# Patient Record
Sex: Female | Born: 1945 | Race: White | Hispanic: No | Marital: Married | Smoking: Former smoker
Health system: Southern US, Community
[De-identification: ages and names within clinical notes are randomized; demographics above are authoritative.]

## PROBLEM LIST (undated history)

## (undated) DIAGNOSIS — E669 Obesity, unspecified: Secondary | ICD-10-CM

## (undated) DIAGNOSIS — K219 Gastro-esophageal reflux disease without esophagitis: Secondary | ICD-10-CM

## (undated) DIAGNOSIS — G4730 Sleep apnea, unspecified: Secondary | ICD-10-CM

## (undated) DIAGNOSIS — E876 Hypokalemia: Secondary | ICD-10-CM

## (undated) DIAGNOSIS — D649 Anemia, unspecified: Secondary | ICD-10-CM

## (undated) DIAGNOSIS — E079 Disorder of thyroid, unspecified: Secondary | ICD-10-CM

## (undated) DIAGNOSIS — C801 Malignant (primary) neoplasm, unspecified: Secondary | ICD-10-CM

## (undated) DIAGNOSIS — K222 Esophageal obstruction: Secondary | ICD-10-CM

## (undated) DIAGNOSIS — E785 Hyperlipidemia, unspecified: Secondary | ICD-10-CM

## (undated) DIAGNOSIS — K5792 Diverticulitis of intestine, part unspecified, without perforation or abscess without bleeding: Secondary | ICD-10-CM

## (undated) DIAGNOSIS — I10 Essential (primary) hypertension: Secondary | ICD-10-CM

## (undated) HISTORY — PX: GASTRIC BYPASS: SHX52

## (undated) HISTORY — DX: Hyperlipidemia, unspecified: E78.5

## (undated) HISTORY — DX: Sleep apnea, unspecified: G47.30

## (undated) HISTORY — DX: Anemia, unspecified: D64.9

## (undated) HISTORY — DX: Malignant (primary) neoplasm, unspecified: C80.1

## (undated) HISTORY — DX: Obesity, unspecified: E66.9

## (undated) HISTORY — DX: Hypokalemia: E87.6

## (undated) HISTORY — DX: Esophageal obstruction: K22.2

## (undated) HISTORY — DX: Gastro-esophageal reflux disease without esophagitis: K21.9

---

## 1999-09-14 ENCOUNTER — Ambulatory Visit (HOSPITAL_BASED_OUTPATIENT_CLINIC_OR_DEPARTMENT_OTHER): Admission: RE | Admit: 1999-09-14 | Discharge: 1999-09-14 | Payer: Self-pay | Admitting: Plastic Surgery

## 1999-09-25 ENCOUNTER — Other Ambulatory Visit: Admission: RE | Admit: 1999-09-25 | Discharge: 1999-09-25 | Payer: Self-pay | Admitting: Family Medicine

## 2000-06-04 ENCOUNTER — Other Ambulatory Visit: Admission: RE | Admit: 2000-06-04 | Discharge: 2000-06-04 | Payer: Self-pay | Admitting: *Deleted

## 2000-06-04 ENCOUNTER — Encounter (INDEPENDENT_AMBULATORY_CARE_PROVIDER_SITE_OTHER): Payer: Self-pay | Admitting: Specialist

## 2000-09-26 ENCOUNTER — Other Ambulatory Visit: Admission: RE | Admit: 2000-09-26 | Discharge: 2000-09-26 | Payer: Self-pay | Admitting: *Deleted

## 2002-05-01 ENCOUNTER — Ambulatory Visit (HOSPITAL_COMMUNITY): Admission: RE | Admit: 2002-05-01 | Discharge: 2002-05-01 | Payer: Self-pay | Admitting: Gastroenterology

## 2002-10-26 ENCOUNTER — Other Ambulatory Visit: Admission: RE | Admit: 2002-10-26 | Discharge: 2002-10-26 | Payer: Self-pay | Admitting: Family Medicine

## 2003-10-05 ENCOUNTER — Emergency Department (HOSPITAL_COMMUNITY): Admission: EM | Admit: 2003-10-05 | Discharge: 2003-10-05 | Payer: Self-pay | Admitting: Emergency Medicine

## 2003-10-28 ENCOUNTER — Encounter: Admission: RE | Admit: 2003-10-28 | Discharge: 2003-10-28 | Payer: Self-pay | Admitting: Family Medicine

## 2003-11-03 ENCOUNTER — Encounter: Admission: RE | Admit: 2003-11-03 | Discharge: 2003-11-03 | Payer: Self-pay | Admitting: Family Medicine

## 2004-12-26 ENCOUNTER — Encounter: Admission: RE | Admit: 2004-12-26 | Discharge: 2005-03-26 | Payer: Self-pay | Admitting: Family Medicine

## 2005-01-24 ENCOUNTER — Other Ambulatory Visit: Admission: RE | Admit: 2005-01-24 | Discharge: 2005-01-24 | Payer: Self-pay | Admitting: Family Medicine

## 2005-02-05 ENCOUNTER — Encounter: Admission: RE | Admit: 2005-02-05 | Discharge: 2005-02-05 | Payer: Self-pay | Admitting: Family Medicine

## 2006-01-28 ENCOUNTER — Encounter: Admission: RE | Admit: 2006-01-28 | Discharge: 2006-01-28 | Payer: Self-pay | Admitting: Family Medicine

## 2006-01-31 ENCOUNTER — Encounter: Admission: RE | Admit: 2006-01-31 | Discharge: 2006-01-31 | Payer: Self-pay | Admitting: Family Medicine

## 2006-02-08 ENCOUNTER — Ambulatory Visit: Payer: Self-pay | Admitting: Internal Medicine

## 2006-08-07 ENCOUNTER — Encounter: Admission: RE | Admit: 2006-08-07 | Discharge: 2006-08-07 | Payer: Self-pay | Admitting: Family Medicine

## 2006-12-18 ENCOUNTER — Ambulatory Visit: Payer: Self-pay | Admitting: *Deleted

## 2006-12-19 ENCOUNTER — Inpatient Hospital Stay (HOSPITAL_COMMUNITY): Admission: EM | Admit: 2006-12-19 | Discharge: 2006-12-20 | Payer: Self-pay | Admitting: Emergency Medicine

## 2006-12-19 ENCOUNTER — Ambulatory Visit: Payer: Self-pay | Admitting: Cardiology

## 2006-12-19 ENCOUNTER — Encounter: Payer: Self-pay | Admitting: Cardiology

## 2006-12-20 ENCOUNTER — Emergency Department (HOSPITAL_COMMUNITY): Admission: EM | Admit: 2006-12-20 | Discharge: 2006-12-21 | Payer: Self-pay | Admitting: Emergency Medicine

## 2007-02-19 ENCOUNTER — Ambulatory Visit: Payer: Self-pay | Admitting: *Deleted

## 2007-02-24 ENCOUNTER — Ambulatory Visit: Payer: Self-pay | Admitting: Internal Medicine

## 2007-02-24 ENCOUNTER — Ambulatory Visit: Payer: Self-pay

## 2007-02-24 LAB — CONVERTED CEMR LAB
CO2: 29 meq/L (ref 19–32)
Creatinine, Ser: 1.1 mg/dL (ref 0.4–1.2)
Glucose, Bld: 88 mg/dL (ref 70–99)
Potassium: 3.7 meq/L (ref 3.5–5.1)
Sodium: 144 meq/L (ref 135–145)

## 2007-03-05 ENCOUNTER — Ambulatory Visit: Payer: Self-pay

## 2007-03-05 ENCOUNTER — Ambulatory Visit: Payer: Self-pay | Admitting: Cardiology

## 2007-04-03 ENCOUNTER — Ambulatory Visit: Payer: Self-pay | Admitting: Internal Medicine

## 2007-04-03 LAB — CONVERTED CEMR LAB
CO2: 30 meq/L (ref 19–32)
GFR calc Af Amer: 59 mL/min
Glucose, Bld: 127 mg/dL — ABNORMAL HIGH (ref 70–99)
Potassium: 3.2 meq/L — ABNORMAL LOW (ref 3.5–5.1)

## 2007-06-06 ENCOUNTER — Ambulatory Visit: Payer: Self-pay | Admitting: Internal Medicine

## 2007-06-06 LAB — CONVERTED CEMR LAB
Calcium: 9 mg/dL (ref 8.4–10.5)
Chloride: 106 meq/L (ref 96–112)
Creatinine, Ser: 1.1 mg/dL (ref 0.4–1.2)
Glucose, Bld: 132 mg/dL — ABNORMAL HIGH (ref 70–99)
Sodium: 142 meq/L (ref 135–145)

## 2007-07-21 ENCOUNTER — Encounter: Admission: RE | Admit: 2007-07-21 | Discharge: 2007-07-21 | Payer: Self-pay | Admitting: Internal Medicine

## 2007-09-08 ENCOUNTER — Ambulatory Visit: Payer: Self-pay | Admitting: Gastroenterology

## 2007-09-22 ENCOUNTER — Ambulatory Visit: Payer: Self-pay | Admitting: Gastroenterology

## 2007-09-22 DIAGNOSIS — K222 Esophageal obstruction: Secondary | ICD-10-CM

## 2007-11-06 DIAGNOSIS — Z862 Personal history of diseases of the blood and blood-forming organs and certain disorders involving the immune mechanism: Secondary | ICD-10-CM

## 2007-11-06 DIAGNOSIS — G4733 Obstructive sleep apnea (adult) (pediatric): Secondary | ICD-10-CM

## 2007-11-06 DIAGNOSIS — F411 Generalized anxiety disorder: Secondary | ICD-10-CM | POA: Insufficient documentation

## 2007-11-06 DIAGNOSIS — Z8679 Personal history of other diseases of the circulatory system: Secondary | ICD-10-CM | POA: Insufficient documentation

## 2007-11-06 DIAGNOSIS — E669 Obesity, unspecified: Secondary | ICD-10-CM

## 2007-11-06 DIAGNOSIS — E119 Type 2 diabetes mellitus without complications: Secondary | ICD-10-CM

## 2007-11-06 DIAGNOSIS — E785 Hyperlipidemia, unspecified: Secondary | ICD-10-CM | POA: Insufficient documentation

## 2007-11-06 DIAGNOSIS — K219 Gastro-esophageal reflux disease without esophagitis: Secondary | ICD-10-CM | POA: Insufficient documentation

## 2007-11-06 DIAGNOSIS — M129 Arthropathy, unspecified: Secondary | ICD-10-CM | POA: Insufficient documentation

## 2007-11-06 DIAGNOSIS — I10 Essential (primary) hypertension: Secondary | ICD-10-CM | POA: Insufficient documentation

## 2007-11-06 DIAGNOSIS — R1310 Dysphagia, unspecified: Secondary | ICD-10-CM | POA: Insufficient documentation

## 2008-01-21 IMAGING — CT CT ANGIO CHEST
2 of 11 series · 13 of 36 positions shown · non-contrast
Comparison: none

CLINICAL DATA: Chest pain, shortness of breath

[Series 5: aortic dissection · axial · 0.80mm/px · z∈[-404,-44]mm · 10 of 177 slices shown]
[im 17/177  lung]
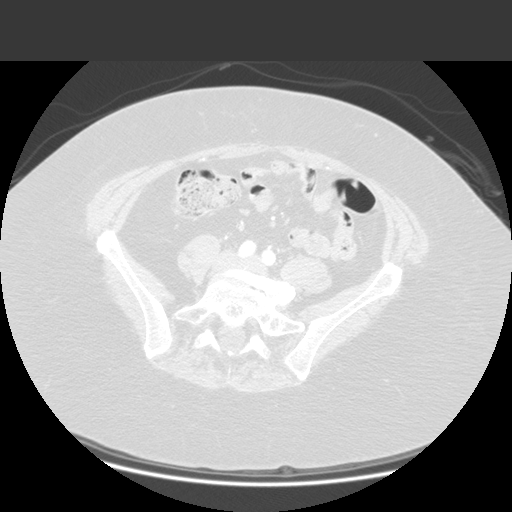
[im 33/177  mediastinal]
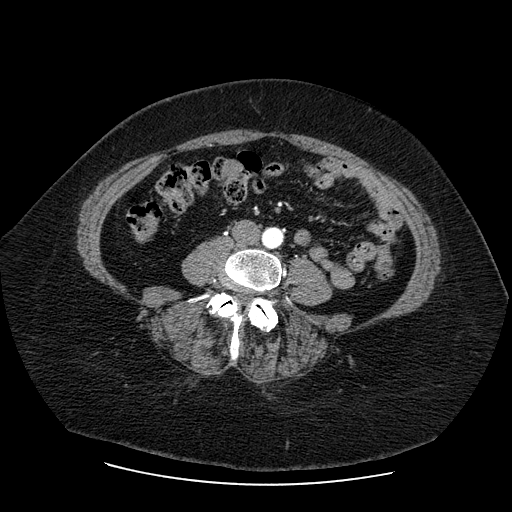
[im 49/177  lung]
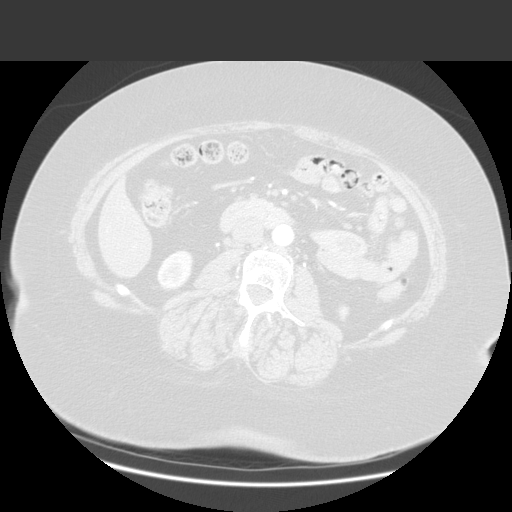
[im 65/177  mediastinal]
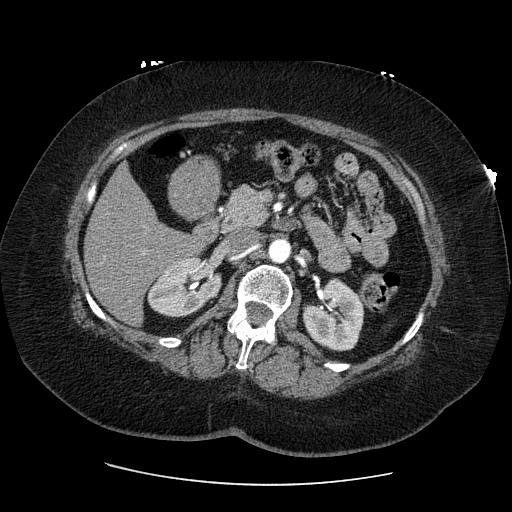
[im 81/177  lung]
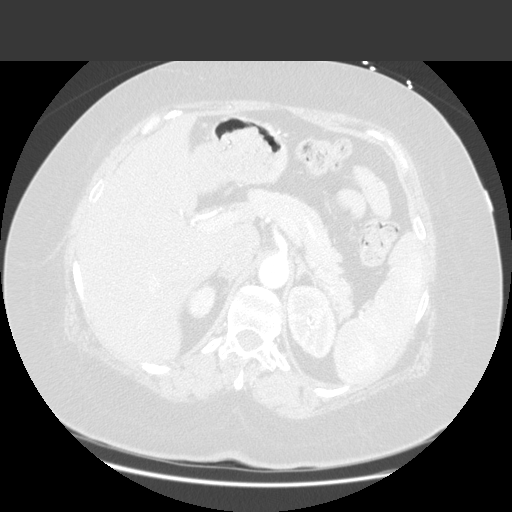
[im 97/177  mediastinal]
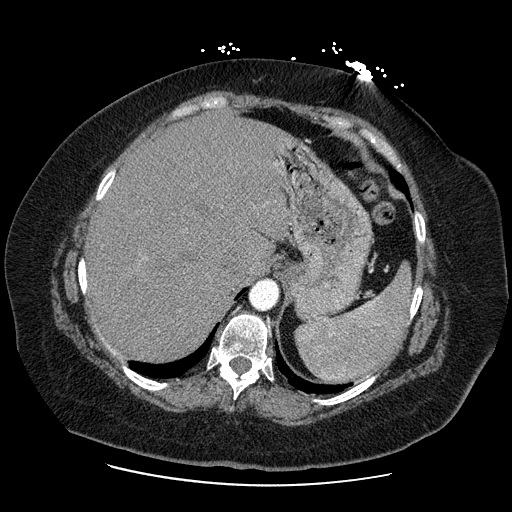
[im 113/177  lung]
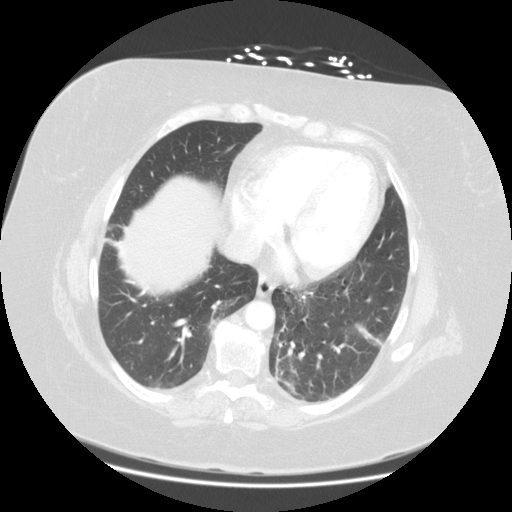
[im 129/177  mediastinal]
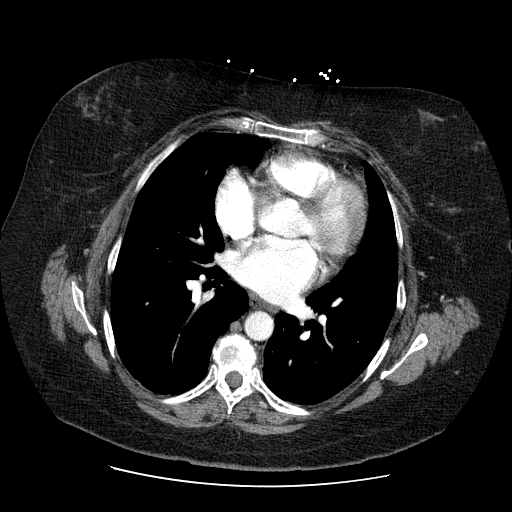
[im 145/177  lung]
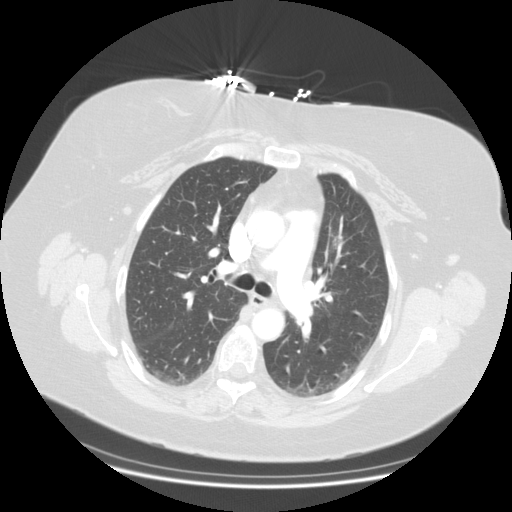
[im 161/177  mediastinal]
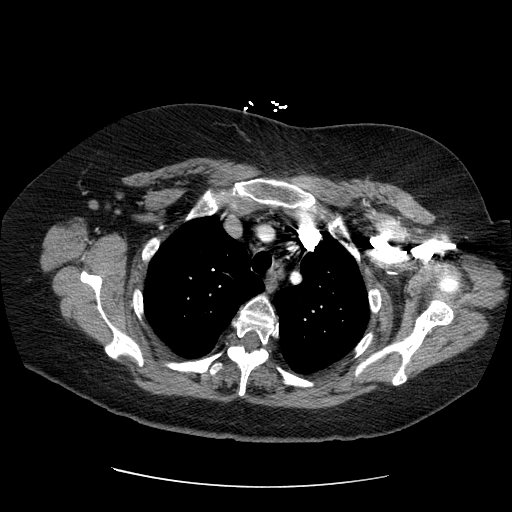

[Series 701: reformatted · coronal · 0.88mm/px · 3 of 173 slices shown]
[im 35/173  mediastinal]
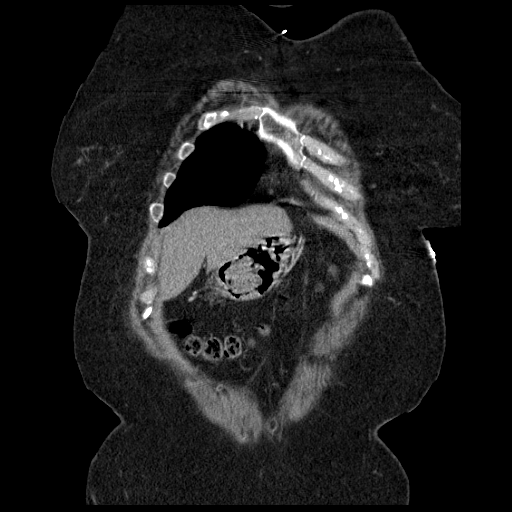
[im 69/173  mediastinal]
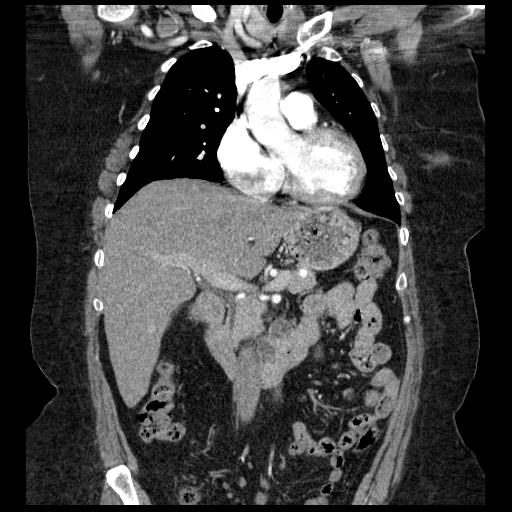
[im 104/173  mediastinal]
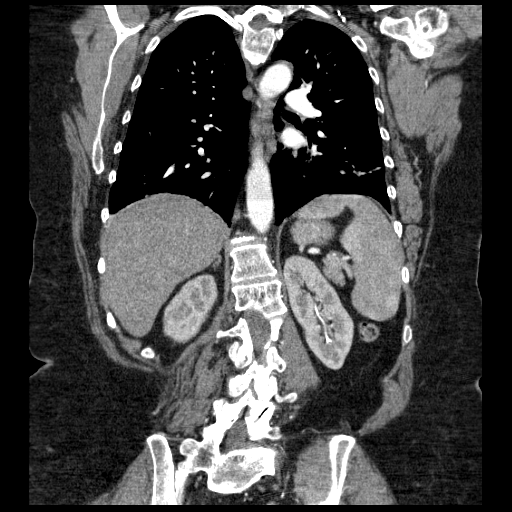

[13 of 36 positions shown; findings below may reference images not displayed]

CT angiogram chest with contrast:

Multidetector helical CT of the chest and abdomen was obtained after 120 ml
Omnipaque 350 IV. CT multiplanar reconstructions were rendered to evaluate the
vascular anatomy.
Comparison 01/31/2006.
The noncontrast scan shows no hyperdense aortic hematoma.
CTA shows good contrast enhancement of the thoracic aorta. No evidence of
dissection, stenosis, or other focal lesion. Classic anatomy   of the
brachiocephalic vessels without proximal stenosis. There is good contrast
opacification of pulmonary artery branches with no filling defects to suggest
acute PE. No pleural or pericardial effusion. Subcentimeter prevascular and
bilateral axillary lymph nodes incidentally noted. No hilar adenopathy.
Bilateral low attenuation thyroid lesions are incidentally noted. Lung windows
show dependent atelectasis posteriorly in both lower lobes. Linear scarring or
atelectasis in the lung bases. No confluent airspace infiltrate or nodule.
Coronal and sagittal reconstructions confirm the above findings. Mild
spondylitic changes in the thoracic spine.
IMPRESSION: 1. Negative for thoracic artery dissection or pulmonary embolus.
2. Nonspecific bilateral thyroid lesions. Consider ultrasound for further
evaluation.

CT angiogram abdomen with contrast:

Abdominal aorta normal in caliber and contour without dissection or stenosis.
Celiac axis and SMA widely patent. Replaced right hepatic artery from the SMA,
an anatomic variant. Single left and single right renal arteries without
stenosis or other focal lesion. Inferior mesenteric artery remains patent.
Common iliac arteries and   visualized portions of proximal external iliac
arteries widely patent. Circumaortic left renal vein is incidentally noted.

Unremarkable arterial phase evaluation of liver, gallbladder, spleen, adrenal
glands, kidneys, pancreas. Visualized portions of small bowel and colon are
decompressed, unremarkable. No free air. No ascites. Subcentimeter left
paraaortic and aortocaval lymph nodes incidentally noted. Degenerative changes
in the lower lumbar spine.
IMPRESSION: 1. Negative for aortic dissection or other acute abdominal process.

## 2008-04-01 ENCOUNTER — Encounter: Payer: Self-pay | Admitting: Gastroenterology

## 2008-04-05 ENCOUNTER — Ambulatory Visit: Payer: Self-pay | Admitting: Gastroenterology

## 2008-04-05 ENCOUNTER — Encounter: Admission: RE | Admit: 2008-04-05 | Discharge: 2008-04-05 | Payer: Self-pay | Admitting: Family Medicine

## 2008-04-05 DIAGNOSIS — R198 Other specified symptoms and signs involving the digestive system and abdomen: Secondary | ICD-10-CM

## 2008-04-09 ENCOUNTER — Ambulatory Visit: Payer: Self-pay | Admitting: Gastroenterology

## 2008-04-09 ENCOUNTER — Encounter: Payer: Self-pay | Admitting: Gastroenterology

## 2008-04-13 ENCOUNTER — Encounter: Payer: Self-pay | Admitting: Gastroenterology

## 2008-04-14 ENCOUNTER — Telehealth: Payer: Self-pay | Admitting: Gastroenterology

## 2008-04-20 ENCOUNTER — Telehealth: Payer: Self-pay | Admitting: Gastroenterology

## 2008-05-04 ENCOUNTER — Ambulatory Visit: Payer: Self-pay | Admitting: Gastroenterology

## 2008-12-10 DIAGNOSIS — I4949 Other premature depolarization: Secondary | ICD-10-CM | POA: Insufficient documentation

## 2008-12-10 DIAGNOSIS — I10 Essential (primary) hypertension: Secondary | ICD-10-CM | POA: Insufficient documentation

## 2008-12-14 ENCOUNTER — Encounter: Payer: Self-pay | Admitting: Internal Medicine

## 2008-12-14 ENCOUNTER — Ambulatory Visit: Payer: Self-pay | Admitting: Internal Medicine

## 2008-12-14 DIAGNOSIS — R072 Precordial pain: Secondary | ICD-10-CM | POA: Insufficient documentation

## 2009-02-18 ENCOUNTER — Encounter: Admission: RE | Admit: 2009-02-18 | Discharge: 2009-02-18 | Payer: Self-pay | Admitting: Family Medicine

## 2009-03-04 ENCOUNTER — Ambulatory Visit (HOSPITAL_COMMUNITY): Admission: RE | Admit: 2009-03-04 | Discharge: 2009-03-04 | Payer: Self-pay | Admitting: General Surgery

## 2009-03-10 ENCOUNTER — Ambulatory Visit (HOSPITAL_COMMUNITY): Admission: RE | Admit: 2009-03-10 | Discharge: 2009-03-10 | Payer: Self-pay | Admitting: General Surgery

## 2009-03-16 ENCOUNTER — Encounter: Admission: RE | Admit: 2009-03-16 | Discharge: 2009-03-16 | Payer: Self-pay | Admitting: General Surgery

## 2009-04-12 ENCOUNTER — Encounter (HOSPITAL_COMMUNITY): Admission: RE | Admit: 2009-04-12 | Discharge: 2009-07-11 | Payer: Self-pay | Admitting: Endocrinology

## 2009-06-30 ENCOUNTER — Encounter: Admission: RE | Admit: 2009-06-30 | Discharge: 2009-09-28 | Payer: Self-pay | Admitting: General Surgery

## 2009-07-25 ENCOUNTER — Inpatient Hospital Stay (HOSPITAL_COMMUNITY): Admission: RE | Admit: 2009-07-25 | Discharge: 2009-07-27 | Payer: Self-pay | Admitting: General Surgery

## 2009-07-26 ENCOUNTER — Ambulatory Visit: Payer: Self-pay | Admitting: Surgery

## 2009-07-26 ENCOUNTER — Encounter (INDEPENDENT_AMBULATORY_CARE_PROVIDER_SITE_OTHER): Payer: Self-pay | Admitting: General Surgery

## 2009-08-17 ENCOUNTER — Encounter: Admission: RE | Admit: 2009-08-17 | Discharge: 2009-08-17 | Payer: Self-pay | Admitting: Endocrinology

## 2009-10-27 ENCOUNTER — Encounter: Admission: RE | Admit: 2009-10-27 | Discharge: 2010-01-25 | Payer: Self-pay | Admitting: General Surgery

## 2009-11-04 ENCOUNTER — Ambulatory Visit (HOSPITAL_COMMUNITY): Admission: RE | Admit: 2009-11-04 | Discharge: 2009-11-04 | Payer: Self-pay | Admitting: Endocrinology

## 2010-01-26 ENCOUNTER — Encounter: Admission: RE | Admit: 2010-01-26 | Discharge: 2010-01-26 | Payer: Self-pay | Admitting: General Surgery

## 2010-03-02 ENCOUNTER — Encounter: Admission: RE | Admit: 2010-03-02 | Discharge: 2010-03-02 | Payer: Self-pay | Admitting: Family Medicine

## 2010-03-15 ENCOUNTER — Ambulatory Visit (HOSPITAL_COMMUNITY): Admission: RE | Admit: 2010-03-15 | Discharge: 2010-03-15 | Payer: Self-pay | Admitting: Family Medicine

## 2010-04-12 IMAGING — US US ABDOMEN COMPLETE
1 series · 13 of 25 positions shown · non-contrast
Comparison: None

CLINICAL DATA: Morbid obesity

COMPLETE ABDOMINAL ULTRASOUND

[Series 1: us abdomen complete · 13 of 74 slices shown]
[im 1/74]
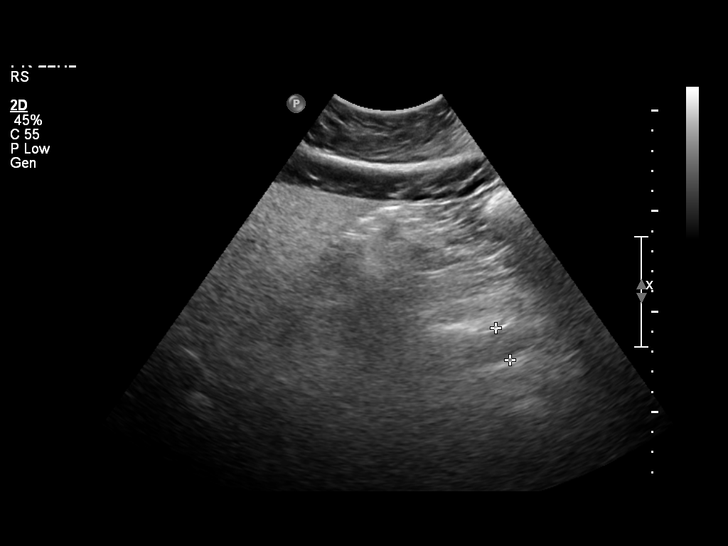
[im 7/74]
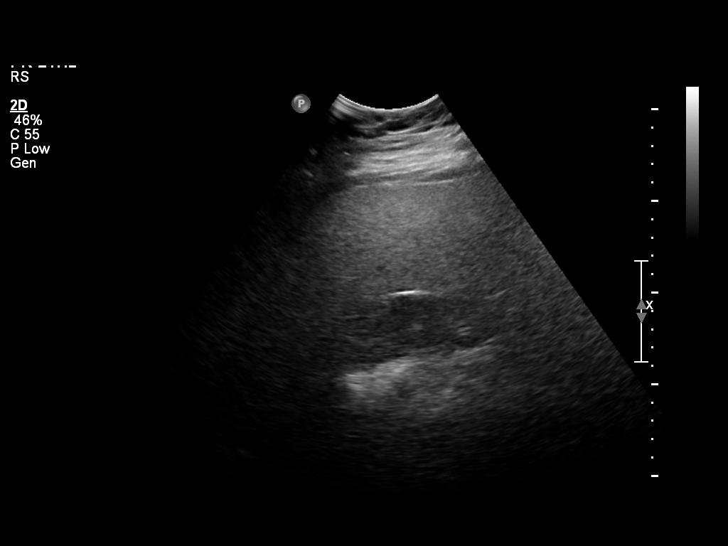
[im 13/74]
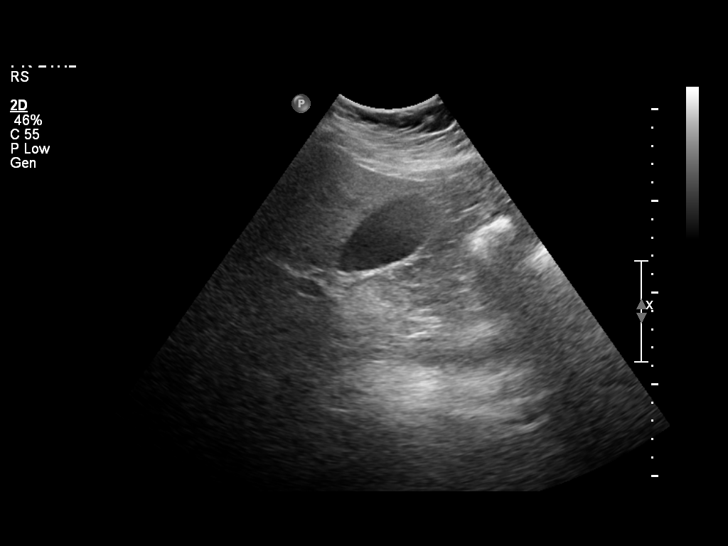
[im 19/74]
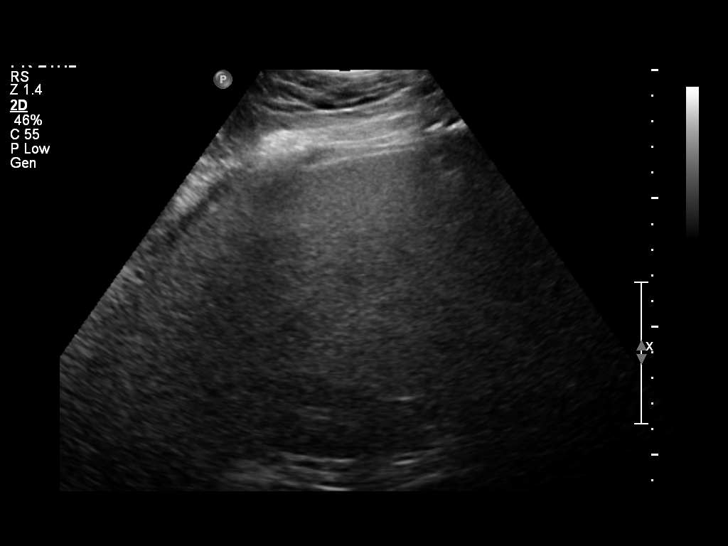
[im 25/74]
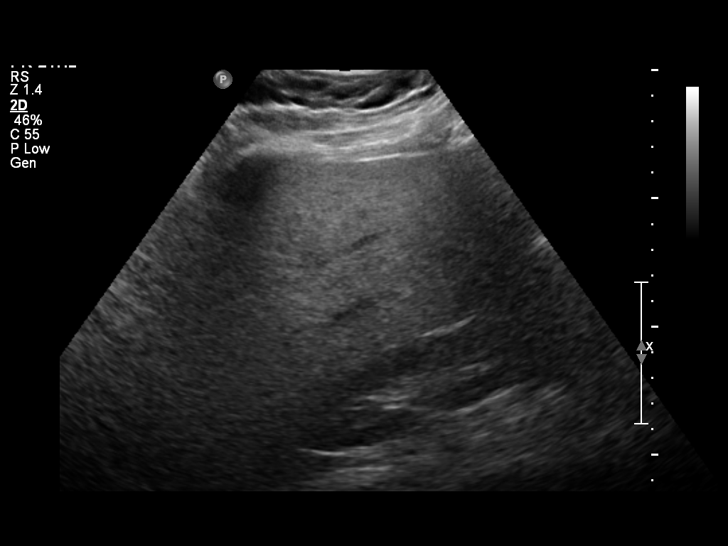
[im 31/74]
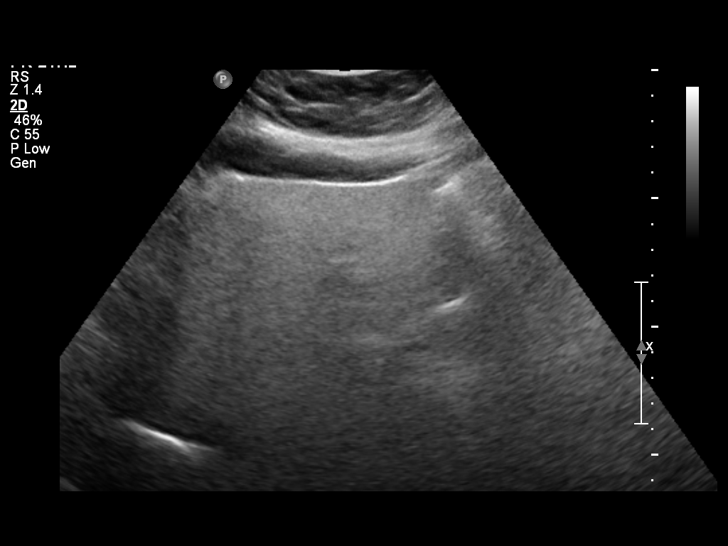
[im 37/74]
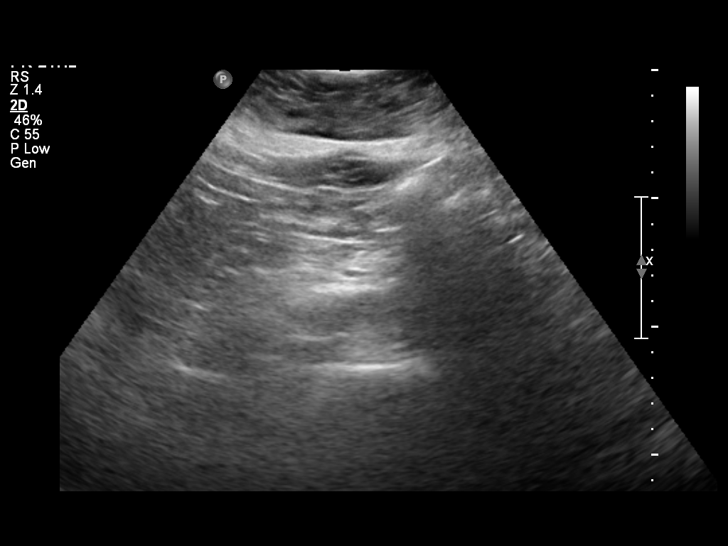
[im 43/74]
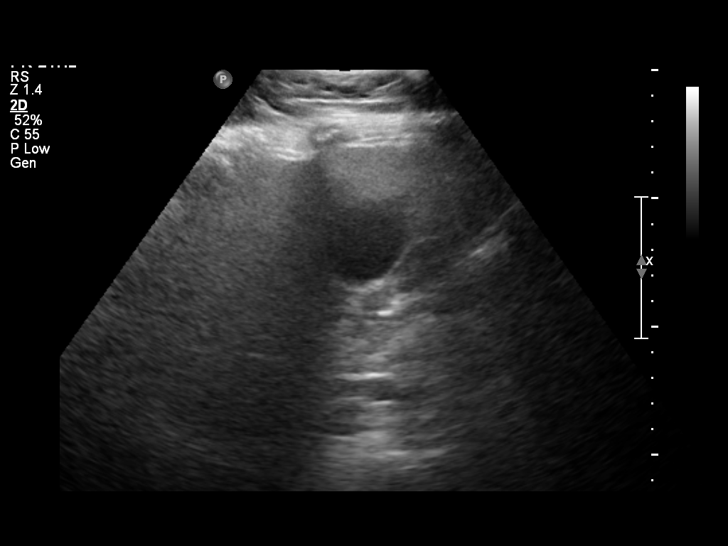
[im 49/74]
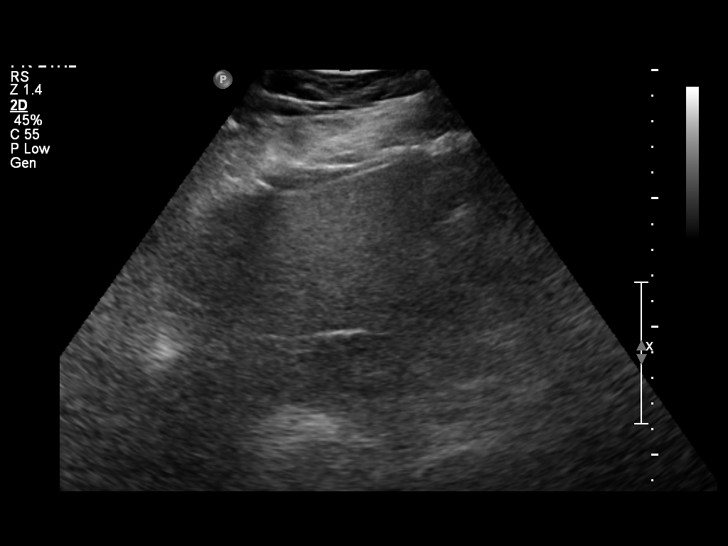
[im 55/74]
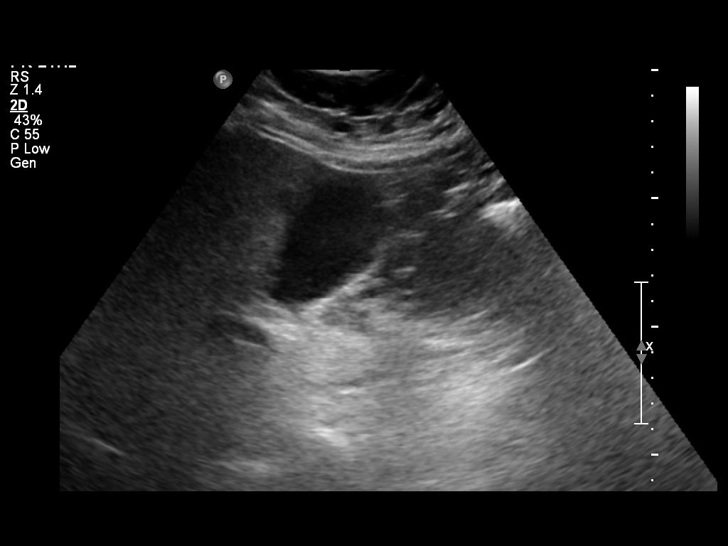
[im 61/74]
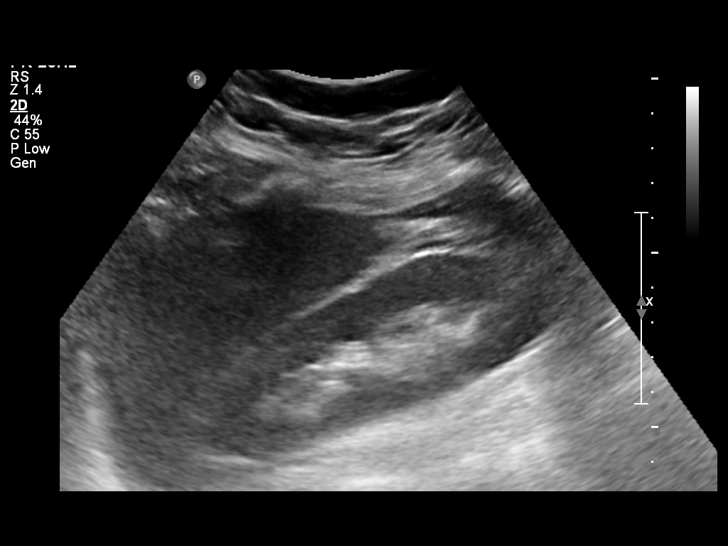
[im 67/74]
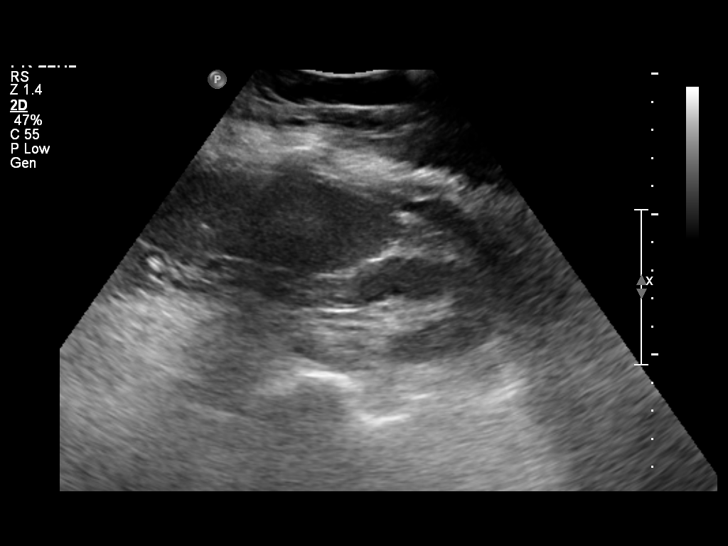
[im 74/74]
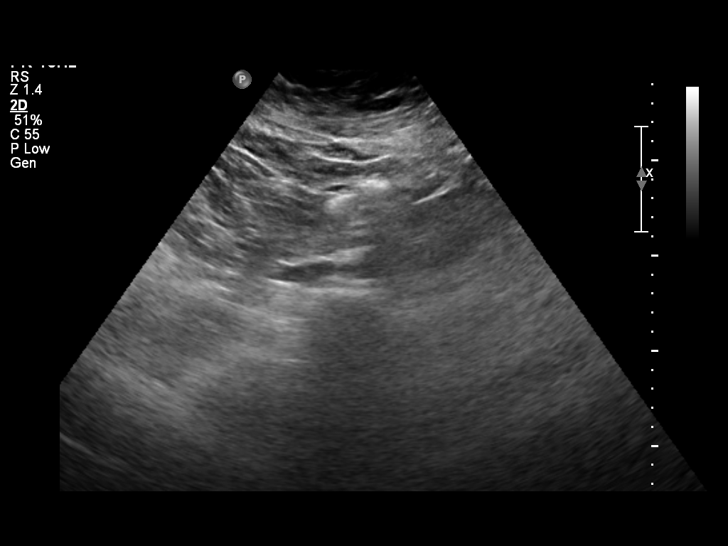

[13 of 25 positions shown; findings below may reference images not displayed]

FINDINGS: Gallbladder:  Distended.  Overall scan resolution is hampered by
patient body habitus but no evidence for intraluminal stones or
sludge is suggested.  No gallbladder wall thickening is suggested.

Common bile duct:  Normal AP width of 4.2 mm

Liver:  Diffusely echogenic with obscuration of the ductal and
venous radicals compatible with diffuse fatty infiltration.  No
definite focal parenchymal abnormalities are seen but evaluation is
hampered by the presence of the diffuse fatty infiltration.

IVC:  Shadowed by the fatty replaced liver.  Not assessed

Pancreas:  Shadowed by overlying bowel gas.  Not assessed

Spleen:  Normal in size and echotexture

Right Kidney:  Sagittal length of 9.2 cm.  Scanning resolution of
the right kidney is diminished by patient habitus but no definite
parenchymal abnormalities or hydronephrosis is evident

Left Kidney:  Normal in appearance with a sagittal length of
cm.  No focal parenchymal abnormalities or hydronephrosis evident

Abdominal aorta:  Poorly evaluated due to patient body habitus and
poor imaging characteristics on today's exam.
IMPRESSION: Overall scan resolution is diminished by the presence of diffuse
fatty infiltration combined with patient body habitus.  Incomplete
assessment of the pancreas, inferior vena cava, aorta and
intrahepatic architecture was possible as a result.  No definite
focal abnormality was noted in the evaluated areas.

## 2010-04-12 IMAGING — CR DG CHEST 2V
2 series · 2 of 2 positions shown · non-contrast
Comparison: 12/19/2006

CLINICAL DATA: Bariatric screening evaluation. History of
hypertension treated with medication.  No other current chest
complaints

CHEST - 2 VIEW

[view not recorded (1 of 2)]
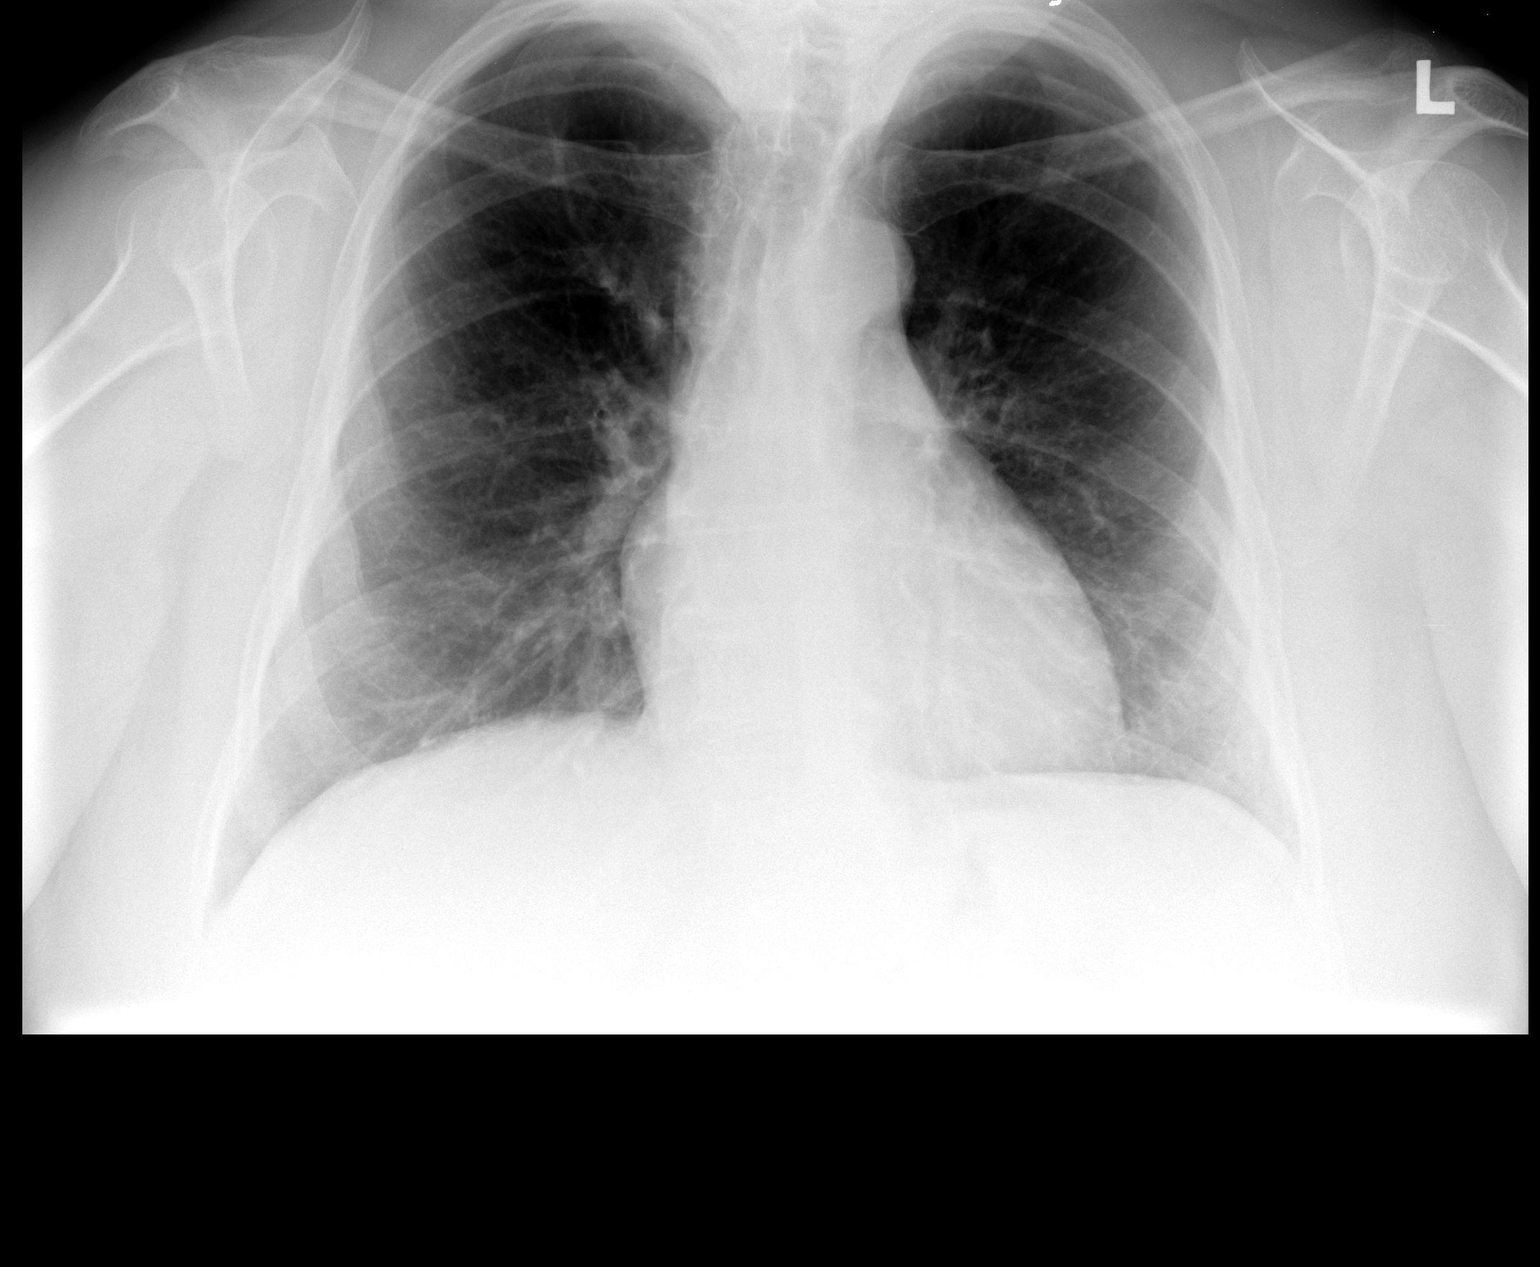

[view not recorded (2 of 2)]
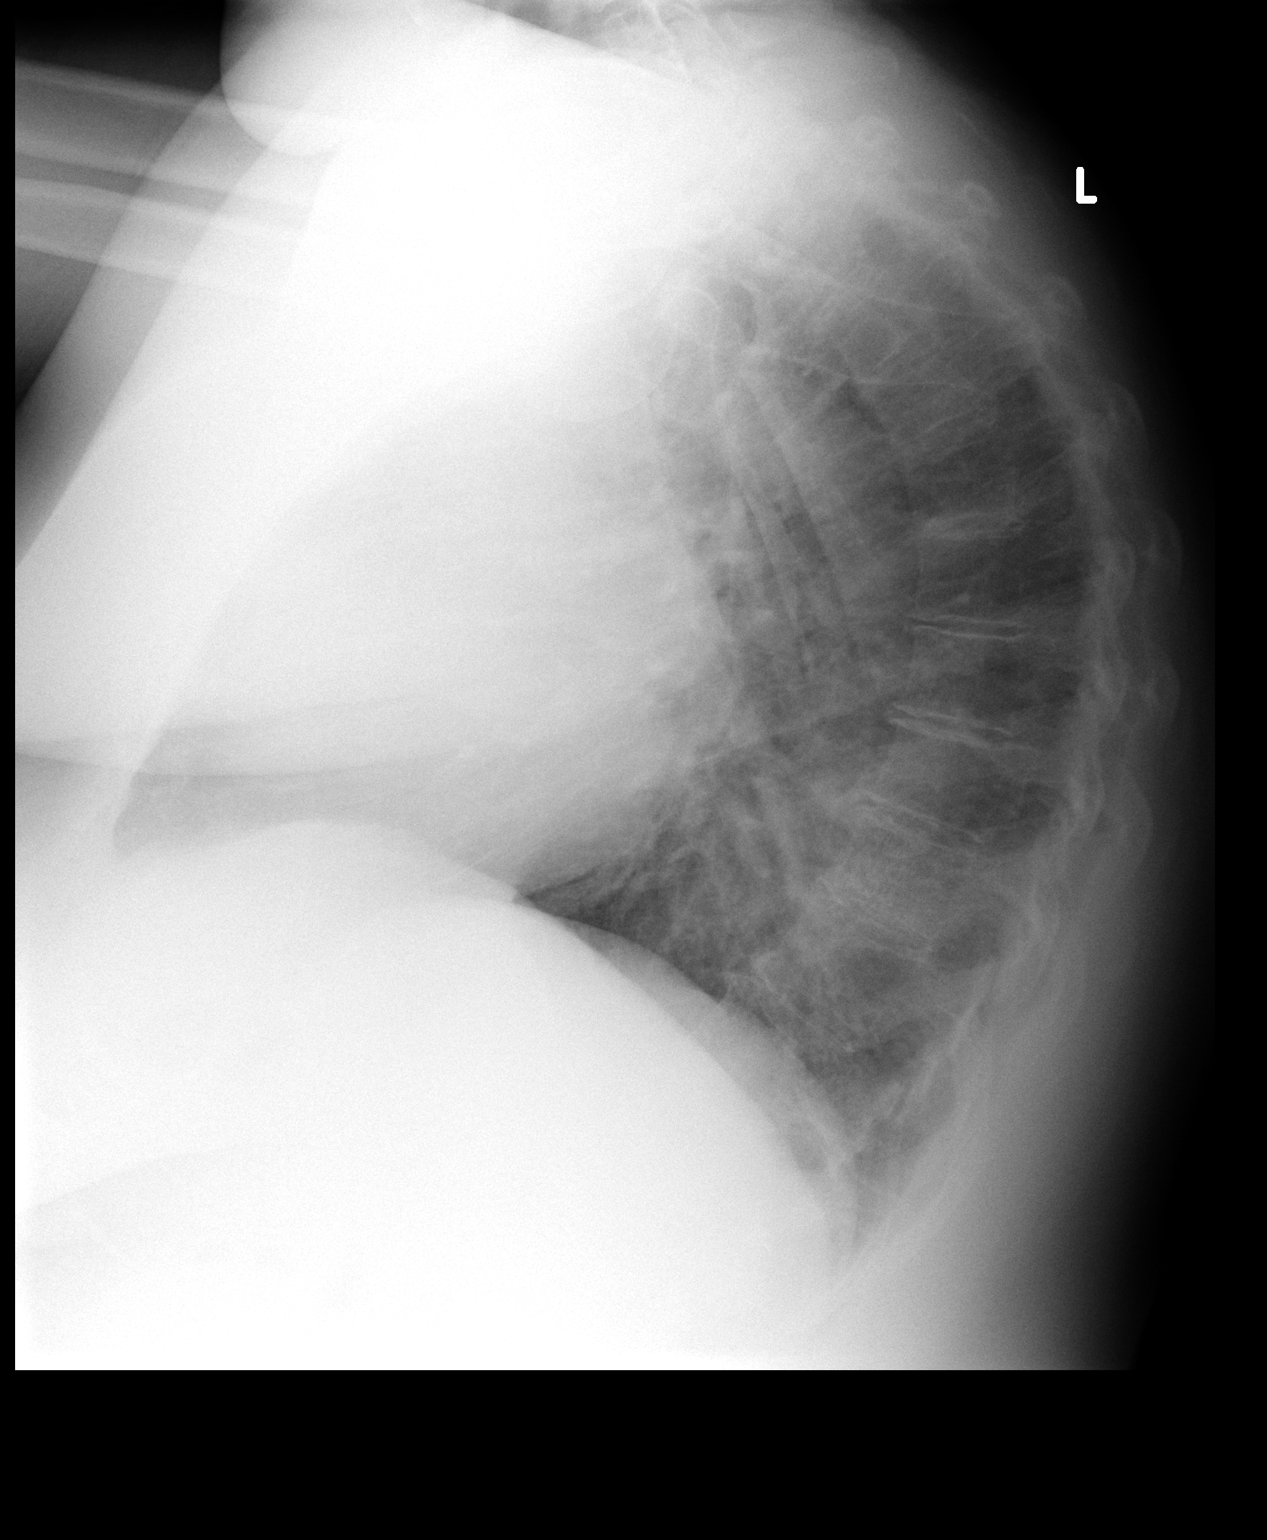

[2 of 2 positions shown; findings below may reference images not displayed]

FINDINGS: Heart and mediastinal contours are within normal limits.
The lung fields appear clear with no evidence for focal infiltrate
or congestive failure.  Bony structures demonstrate some mild
degenerative changes of the lower thoracic spine and otherwise
appear intact.  Some deformity of the right fourth, fifth and sixth
ribs are noted laterally and may represent old healed rib
fractures.  These are stable in comparison with the prior exam from
IMPRESSION: No acute cardiopulmonary abnormality noted.

## 2010-05-01 ENCOUNTER — Encounter: Admission: RE | Admit: 2010-05-01 | Discharge: 2010-05-01 | Payer: Self-pay | Admitting: General Surgery

## 2010-08-02 ENCOUNTER — Encounter
Admission: RE | Admit: 2010-08-02 | Discharge: 2010-08-02 | Payer: Self-pay | Source: Home / Self Care | Attending: General Surgery | Admitting: General Surgery

## 2010-11-04 ENCOUNTER — Encounter: Payer: Self-pay | Admitting: Family Medicine

## 2011-01-18 LAB — DIFFERENTIAL
Basophils Absolute: 0 10*3/uL (ref 0.0–0.1)
Eosinophils Absolute: 0 10*3/uL (ref 0.0–0.7)
Eosinophils Absolute: 0.1 10*3/uL (ref 0.0–0.7)
Eosinophils Relative: 2 % (ref 0–5)
Eosinophils Relative: 2 % (ref 0–5)
Lymphocytes Relative: 13 % (ref 12–46)
Lymphocytes Relative: 28 % (ref 12–46)
Lymphs Abs: 0.9 10*3/uL (ref 0.7–4.0)
Lymphs Abs: 1.1 10*3/uL (ref 0.7–4.0)
Lymphs Abs: 1.7 10*3/uL (ref 0.7–4.0)
Monocytes Absolute: 0.4 10*3/uL (ref 0.1–1.0)
Monocytes Absolute: 0.5 10*3/uL (ref 0.1–1.0)
Monocytes Relative: 10 % (ref 3–12)
Neutro Abs: 3.8 10*3/uL (ref 1.7–7.7)
Neutro Abs: 5.2 10*3/uL (ref 1.7–7.7)
Neutrophils Relative %: 77 % (ref 43–77)

## 2011-01-18 LAB — CBC
HCT: 32.5 % — ABNORMAL LOW (ref 36.0–46.0)
HCT: 40.8 % (ref 36.0–46.0)
Hemoglobin: 11.1 g/dL — ABNORMAL LOW (ref 12.0–15.0)
Hemoglobin: 14.1 g/dL (ref 12.0–15.0)
MCV: 89.7 fL (ref 78.0–100.0)
MCV: 90.3 fL (ref 78.0–100.0)
Platelets: 195 10*3/uL (ref 150–400)
Platelets: 254 10*3/uL (ref 150–400)
RBC: 3.73 MIL/uL — ABNORMAL LOW (ref 3.87–5.11)
RDW: 13.3 % (ref 11.5–15.5)
RDW: 13.8 % (ref 11.5–15.5)
WBC: 4.3 10*3/uL (ref 4.0–10.5)
WBC: 6.1 10*3/uL (ref 4.0–10.5)
WBC: 6.7 10*3/uL (ref 4.0–10.5)

## 2011-01-18 LAB — COMPREHENSIVE METABOLIC PANEL
AST: 66 U/L — ABNORMAL HIGH (ref 0–37)
BUN: 23 mg/dL (ref 6–23)
CO2: 26 mEq/L (ref 19–32)
Calcium: 9.4 mg/dL (ref 8.4–10.5)
Chloride: 100 mEq/L (ref 96–112)
Creatinine, Ser: 1.25 mg/dL — ABNORMAL HIGH (ref 0.4–1.2)
GFR calc Af Amer: 53 mL/min — ABNORMAL LOW (ref >60)
GFR calc non Af Amer: 44 mL/min — ABNORMAL LOW (ref >60)
Total Bilirubin: 0.8 mg/dL (ref 0.3–1.2)

## 2011-01-18 LAB — HEMOGLOBIN AND HEMATOCRIT, BLOOD
HCT: 33.2 % — ABNORMAL LOW (ref 36.0–46.0)
HCT: 36 % (ref 36.0–46.0)
Hemoglobin: 11.3 g/dL — ABNORMAL LOW (ref 12.0–15.0)
Hemoglobin: 12.4 g/dL (ref 12.0–15.0)

## 2011-01-29 ENCOUNTER — Other Ambulatory Visit: Payer: Self-pay | Admitting: Internal Medicine

## 2011-02-16 ENCOUNTER — Other Ambulatory Visit (HOSPITAL_COMMUNITY): Payer: Self-pay | Admitting: Family Medicine

## 2011-02-16 ENCOUNTER — Other Ambulatory Visit (HOSPITAL_COMMUNITY): Payer: Self-pay | Admitting: Physician Assistant

## 2011-02-16 DIAGNOSIS — Z1231 Encounter for screening mammogram for malignant neoplasm of breast: Secondary | ICD-10-CM

## 2011-02-27 NOTE — Letter (Signed)
September 08, 2007    Talmadge Coventry, M.D.  [address]  [place],  [place] [address]   RE:  [name] [name], [name] [name]  MRN:  [id number]  /  DOB:  01/24/1946   Dear Dr. Smith Mince:   Upon your kind referral, I had the pleasure of evaluating your patient  and I am pleased to offer my findings.  I saw Ms. [name] [name] in the  office today.  Enclosed is a copy of my progress note that details my  findings and recommendations.   Thank you for the opportunity to participate in your patient's care.    Sincerely,      Barbette Hair. Arlyce Dice, MD,FACG  Electronically Signed    RDK/MedQ  DD: 09/08/2007  DT: 09/08/2007  Job #: [id number]

## 2011-02-27 NOTE — Assessment & Plan Note (Signed)
[facility]                            CARDIOLOGY OFFICE NOTE   NAME:[name] [name] AMSTUTZ                    MRN:          [id number]  DATE:04/03/2007                            DOB:          May 31, 1946    PRIMARY CARE PHYSICIAN:  Dr. Talmadge Coventry   INTERVAL HISTORY:  [name] [name] is a delightful 65 year old woman with a  history of hypertension, hyperlipidemia and diabetes.  She has a 9-beat  run of nonsustained ventricular tachycardia as part of her sleep study.  She has subsequently undergone a very thorough work-up including cardiac  catheterization which showed normal LV function, normal coronaries, as  well as chest CT which showed no evidence of pulmonary embolus.  Echocardiogram was normal.  She underwent exercise testing during which  she walked 6 minutes on a Bruce protocol.  She had nonspecific ST  changes and no evidence of inducible VT.   She also underwent heart monitor which showed occasional PVCs with no  sustained arrhythmias.  She says she continues to have occasional  palpitations but nothing prolonged.  She has never had syncope or  presyncope in this setting.  She denies any chest pain.  Her potassium  was a little bit low at 3.7 and Dr. Smith Mince increased her potassium  supplementation to 40 mEq a day.   CURRENT MEDICATIONS:  1. Toprol XL 50 daily.  2. Prilosec 20 daily.  3. Celexa 20 daily.  4. Maxzide 75/50.  5. Vytorin 10/40.  6. Aspirin 81.  7. Potassium 40 mEq daily.  8. Mirapex 1 tablet daily.   PHYSICAL EXAMINATION:  GENERAL:  She is well-appearing, in no acute  distress. Ambulates around the clinic without any respiratory  difficulty.  VITAL SIGNS:  Blood pressure is 134/86, heart rate 72, weight is 263.  HEENT:  Normal.  NECK:  Supple, no JVD. Carotids are 2+ bilaterally without any bruits.  There is no lymphadenopathy or thyromegaly.  CARDIAC:  Regular rate and rhythm, no murmurs, rubs, or gallops, PMI  is  non displaced.  LUNGS:  Clear without any wheezes or rales.  ABDOMEN:  Obese, nontender, nondistended, no obvious hepatosplenomegaly,  no bruits, no masses. Good bowel sounds.  EXTREMITIES:  Warm with no cyanosis, clubbing or edema, no rash.  NEURO:  Alert and oriented x3. Cranial nerves II-XII are intact.  Moves  all four extremities without difficulty.  Affect is very pleasant.   ASSESSMENT AND PLAN:  Palpitations with intermittent PVCs and 9 beats of  non sustained ventricular tachycardia during sleep study.  Given her  normal ejection fraction, these are likely benign with a very, very  small risk of any serious arrhythmias.  Will go ahead and increase her  Toprol to 100 mg a day to see if that helps.  We will also recheck  electrolytes to make sure her potassium is greater than 4 and magnesium  is greater than 2.  We will check her thyroid to make sure this is  normal.  Otherwise she will followup with [place] in 1 year for routine  followup.     [name] Buckles. Bensimhon, MD  Electronically Signed    DRB/MedQ  DD: 04/03/2007  DT: 04/03/2007  Job #: [id number]   cc:   Talmadge Coventry, M.D.

## 2011-02-27 NOTE — Procedures (Signed)
[facility]                              EXERCISE TREADMILL   NAME:[name] [name], [name] [name]                    MRN:          [id number]  DATE:03/05/2007                            DOB:          [birth year]    [name] [name] is a 65 year old white female, who recently had normal  coronary arteries and LV function on a catheterization in March 2008.  She was found to have nine beats of V-tach during a sleep study in the  setting of hypokalemia.  She is having the stress test done today to  rule out stress-induced V-tach.  The patient is wearing an event  recorder and has had some palpitations which she has recorded.   Patient exercised 6 minutes on the Bruce protocol, attaining a heart  rate of 144, which was 88% of her maximum predicted heart rate.  Test  was stopped due to fatigue and shortness of breath.  She did not have  any arrhythmias during the test.  She did have some nonspecific STT-wave  changes inferolateral at peak exercise, which resolved in recovery.   I have given the patient reassurance and she has an appointment to see  Dr. Gala Romney back this month.      Jacolyn Reedy, PA-C  Electronically Signed      Salvadore Farber, MD  Electronically Signed   ML/MedQ  DD: 03/05/2007  DT: 03/05/2007  Job #: [id number]   cc:   Bevelyn Buckles. Bensimhon, MD

## 2011-02-27 NOTE — Assessment & Plan Note (Signed)
[facility]                            CARDIOLOGY OFFICE NOTE   NAME:[name] [name], [name] [name]                    MRN:          [id number]  DATE:02/19/2007                            DOB:          04-26-1946    This is a pleasant 65 year old white female patient who we saw back in  March for chest pain who underwent cardiac catheterization, which was  normal; coronary arteries and LV functions with possible diastolic  dysfunction and echo findings of ejection fraction 60% and CT was  negative for P.E. or aortic dissection. The patient then underwent sleep  study and was found to have 9 beats of Vtach. Because of that, she is  sent here for further evaluation. She says she has palpitations that  feel like a pounding in her heart. It can occur five times a day or  sometimes once a week. She finds it happens more often after she eats.  She started an exercise program six weeks ago and has no symptoms with  exercise. She only drinks one cup of coffee a day. It should be noted  that she was hypokalemic in the hospital with her potassium at 3.0 and  last week in Dr.  Stephannie Peters office it was also low, but she does not  know the number. She went from 10 mEq of potassium and is now taking 20.   CURRENT MEDICATIONS:  1. Toprol XL 50 mg daily.  2. Prilosec 20 mg daily.  3. Celexa 20 mg daily.  4. Maxzide 75/50 daily.  5. Vytorin 10/40 daily.  6. Aspirin 81 mg daily.  7. Asmanex daily.  8. Klor-Con M20 daily.  9. Mirapex once daily.   PHYSICAL EXAMINATION:  This is a pleasant 65 year old white female in no  acute distress. Blood pressure 130/91, pulse 64, weight 266.  NECK: Without JVD, HJR, bruit or thyroid enlargement.  LUNGS:  Clear, anterior, posterior and lateral.  HEART: Regular rate and rhythm at 60 beats per minute. Normal S1, S2.  Positive S4. No murmur, rub, thrill or bruit or heave noted.  ABDOMEN: Is obese. Normoactive bowel sounds are heard  throughout.  EXTREMITIES: Without cyanosis, clubbing or edema. She has good distal  pulses.   EKG: Normal sinus rhythm with LVH. Nonspecific ST-T wave changes. No  acute change from prior tracing.   IMPRESSION:  1. Nine beats of VTach during sleep study in the setting of      hypokalemia.  2. Normal coronary arteries and left ventricular function  in March      2008.  3. Hypertension.  4. Hyperlipidemia.  5. History of benign esophageal stricture in the remote past, status      post dilatation.  6. Probable diastolic dysfunction.  7. Gastroesophageal reflux disease.  8. History of anemia.  9. Mild obstructive sleep apnea.   PLAN:  At this time, we will do a stress test to rule out stress-induced  arrhythmias as well as an event recorder to rule out significant  arrhythmias or VTach. We will also recheck a potassium next week in  light of her recent hypokalemia. She will  see Dr.  Gala Romney after the  tests are performed in one month.      Jacolyn Reedy, PA-C  Electronically Signed      Cecil Cranker, MD, [facility]  Electronically Signed   ML/MedQ  DD: 02/19/2007  DT: 02/19/2007  Job #: [id number]

## 2011-02-27 NOTE — Assessment & Plan Note (Signed)
[facility]                         GASTROENTEROLOGY OFFICE NOTE   NAME:[name] [name] LAURSEN                    MRN:          [id number]  DATE:09/08/2007                            DOB:          1946/02/13    REASON FOR CONSULTATION:  Chest pressure.   [name] [name] is a pleasant 65 year old white female referred through  the courtesy of Dr. Smith Mince for evaluation. For several months, she  has been complaining of chest pressure. This occurs after exercise and  may occur spontaneously. It is somewhat poorly described complaint  characterized by pressure-like sensation in her mid chest, very mild  intensity. She underwent a cardiac workup including cardiac cath that  was negative. She does complain of dysphagia to solids and occasional  pyrosis. She is taking Prilosec daily. She is on no gastric irritants  including nonsteroidals. She denies odynophagia or abdominal pain. There  is no history of cough, sore throat or shortness of breath. She  underwent colonoscopy approximately 5 years ago that apparently was  negative.   PAST MEDICAL HISTORY:  1. Hypertension.  2. Diabetes.  3. She has had arrhythmias.  4. Arthritis.  5. Anxiety.   FAMILY HISTORY:  Pertinent for son with diabetes.   MEDICATIONS:  1. Prilosec.  2. Celexa.  3. Maxzide.  4. Vytorin.  5. Baby aspirin.  6. Potassium.  7. Mirapex.  8. Toprol XL.  9. Ambien.   ALLERGIES:  None.   SOCIAL HISTORY:  She does not smoke. She drinks rarely. 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   REVIEW OF SYSTEMS:  Is positive for joint pains, arrhythmias and back  pain.   PHYSICAL EXAMINATION:  She is an obese female, pulse 64, blood pressure  126/80, weight 255.  HEENT: EOMI.  PERRLA.  Sclerae are anicteric.  Conjunctivae are pink.  NECK:  Supple without thyromegaly, adenopathy or carotid bruits.  CHEST:  Clear to auscultation and percussion without adventitious  sounds.  CARDIAC:   Regular rhythm; normal S1 S2.  There are no murmurs, gallops  or rubs.  ABDOMEN:  Bowel sounds are normoactive.  Abdomen is soft, nontender and  nondistended.  There are no abdominal masses, tenderness, splenic  enlargement or hepatomegaly.  EXTREMITIES:  Full range of motion.  No cyanosis, clubbing or edema.  RECTAL:  Deferred.   IMPRESSION:  1. Dysphagia, rule out early esophageal stricture.  2. Chest pressure. This could be manifestation of gastroesophageal      reflux disease though she is taking a PPI therapy.  3. Diabetes.  4. Obesity.   RECOMMENDATION:  1. Increase Prilosec to 40 mg a day.  2. Upper endoscopy with dilatation as indicated.     Barbette Hair. Arlyce Dice, MD,FACG  Electronically Signed    RDK/MedQ  DD: 09/08/2007  DT: 09/08/2007  Job #: [id number]   cc:   Talmadge Coventry, M.D.  Bevelyn Buckles. Bensimhon, MD

## 2011-03-02 NOTE — H&P (Signed)
NAME:  MELIS, TROCHEZ NO.:  [id number]   MEDICAL RECORD NO.:  [id number]          PATIENT TYPE:  EMS   LOCATION:  [facility]                         FACILITY:  [facility]   PHYSICIAN:  Michaelyn Barter, M.D. DATE OF BIRTH:  16-Sep-1946   DATE OF ADMISSION:  12/19/2006  DATE OF DISCHARGE:                              HISTORY & PHYSICAL   PRIMARY CARE PHYSICIAN:  Dr. Talmadge Coventry.   CHIEF COMPLAINT:  Chest tightness and shortness of breath.   HISTORY OF PRESENT ILLNESS:  Ms. Carstens is a 65 year old female with a  past medical history of hypertension, hypercholesterolemia and  borderline diabetes mellitus.  She states that approximately 3 weeks ago  she began exercising at the gym.  She went on to state that 2 weeks ago  she began to develop chest tightness.  She stated that whenever she gets  onto the elliptical machine at the gym, she becomes very short of  breath.  She went on to state that her chest tightness has become more  frequent and more intense over the past 2 weeks.  Yesterday, she  developed the chest tightness earlier in the day; it continued to  progress throughout the course of the day.  She has some chronic left-  sided jaw pain, but indicated that the left side of her jaw began to  hurt.  She decided to go to the Urgent Care at 6:30 p.m.  An EKG was  completed and she was told that it had some abnormal findings; she then  was referred to [facility] ER for further evaluation.  Her chest  discomfort occasionally travels to her back.  She states that  occasionally she feels as if she is struggling to breathe and at times  finds it difficult to take deep breaths.  She denies having any  diaphoresis.  She has experienced a night-time cough, but denies fevers  or chills.  No nausea or vomiting.  She had a stress test completed  greater than 5 years ago or longer; she cannot remember who did it; she  cannot remember the results.  She states that her chest  tightness feels  like someone is sitting on her chest.   PAST MEDICAL HISTORY:  1. Hypertension.  2. Hypercholesterolemia.  3. Borderline diabetes mellitus.   PAST SURGICAL HISTORY:  Removal of facial basal cell carcinoma x3.   ALLERGIES:  No known drug allergies.   HOME MEDICATIONS:  1. Celexa.  2. Maxzide.  3. Toprol.  4. Potassium.  5. Aspirin.  6. Prilosec OTC.  7. Requip.  8. Ambien p.r.n.   SOCIAL HISTORY:  The patient is employed as an Airline pilot.  Cigarettes:  She stopped smoking 27 years ago.  She started smoking at the age of 76.  She smoked a half a pack of cigarettes per day.  Alcohol:  Occasional.   FAMILY HISTORY:  The patient's mother died of a heart attack at the age  of 64.  The patient's mother had a stroke at the age of 80.  The  patient's father died of a brain tumor at the age of 20.  The patient's  maternal aunt and uncle died of MIs.   REVIEW OF SYSTEMS:  As per HPI; otherwise, all other systems are  negative.   PHYSICAL EXAMINATION:  GENERAL:  The patient is cooperative.  She shows  no obvious distress.  VITALS:  Her temperature is 97.2, blood pressure 122/69, heart rate 59,  respirations 20 and O2 SAT 95%.  HEENT:  Normocephalic, atraumatic.  Anicteric.  Extraocular movements  are intact.  Pupils are equally reactive to light.  NECK:  No JVD.  Supple.  No lymphadenopathy.  Thyroid not palpable.  CARDIAC:  S1 and S2 present.  No murmurs, gallops or rubs.  RESPIRATORY:  No crackles or wheezes.  ABDOMEN:  Soft, nontender and non-distended.  Positive bowel sounds.  EXTREMITIES:  No leg edema.  NEUROLOGICAL:  Alert and oriented x3.  MUSCULOSKELETAL:  Upper and lower extremity strength 5/5.   LABORATORY AND ACCESSORY CLINICAL DATA:  A pH of 7.372, pCO2  44.4,  bicarb 25.8.  Hemoglobin 12.6, hematocrit 37.0.  Sodium 138, potassium  3.0, chloride 105, CO2 27, BUN 18, creatinine 1.0, glucose 97.  CK-MB  POC 4.0, troponin I POC less than 0.05,  myoglobin POC is 131.   Chest x-ray reveals cardiomegaly.  No acute cardiopulmonary disease was  noted.   EKG reveals T-wave inversions in lead III and lead VI.   ASSESSMENT AND PLAN:  1. Chest pain:  The etiology is cardiac vs noncardiac.  The patient      has numerous cardiovascular risk factors including hypertension,      hyperlipidemia, borderline diabetes mellitus, as well as a mother      who died from a myocardial infarction and a remote history of      cigarette smoking.  Therefore, we will cycle the patient's cardiac      enzymes x2 every 8 hours apart.  We will provide intravenous      nitroglycerin, aspirin, morphine and check a 2-D echocardiogram.      We will consult Cardiology.  We will consider starting heparin      intravenously on the patient; however, given the patient's      description of chest pain with occasional radiation to her back, I      feel as though a dissection has to be ruled out also; therefore, we      will order a CT scan of the chest with intravenous contrast.  2. Hypokalemia:  We will replace.  3. Hypertension:  This is stable.  4. Hyperlipidemia:  We will check a fasting lipid profile.  5. Borderline diabetes mellitus:  We will check a hemoglobin A1c and      order Accu-Cheks.  6. Gastrointestinal prophylaxis:  We will provide Protonix.      Michaelyn Barter, M.D.  Electronically Signed     OR/MEDQ  D:  12/19/2006  T:  12/19/2006  Job:  [id number]   cc:   Talmadge Coventry, M.D.

## 2011-03-02 NOTE — Procedures (Signed)
[facility]  Patient:    LUVERNE, FARONE Visit Number: [id number] MRN: [id number]          Service Type: END Location: ENDO Attending Physician:  Charna Elizabeth Dictated by:   Anselmo Rod, M.D. Proc. Date: 05/01/02 Admit Date:  05/01/2002 Discharge Date: 05/01/2002   CC:         Talmadge Coventry, M.D.   Procedure Report  DATE OF BIRTH:  1946/02/22.  PROCEDURE:  Colonoscopy.  ENDOSCOPIST:  Anselmo Rod, M.D.  INSTRUMENT USED:  Olympus video colonoscope.  INDICATION FOR PROCEDURE:  Guaiac-positive stools in a 65 year old white female with an unrevealing EGD.  Rule out colonic polyps, masses, hemorrhoids, etc.  PREPROCEDURE PREPARATION:  Informed consent was procured from the patient. The patient was fasted for eight hours prior to the procedure and prepped with a bottle of magnesium citrate and a gallon of NuLytely the night prior to the procedure.  PREPROCEDURE PHYSICAL:  VITAL SIGNS:  The patient had stable vital signs.  NECK:  Supple.  CHEST:  Clear to auscultation.  S1, S2 regular.  ABDOMEN:  Soft with normal bowel sounds.  DESCRIPTION OF PROCEDURE:  The patient was placed in the left lateral decubitus position and sedated with an additional 20 mg of Demerol and 2 mg of Versed intravenously.  Once the patient was adequately sedate and maintained on low-flow oxygen and continuous cardiac monitoring, the Olympus video colonoscope was advanced from the rectum to the cecum without difficulty.  The appendiceal orifice and the ileocecal valve were clearly visualized and photographed.  No masses, polyps, erosions, ulcerations, or diverticula were seen.  IMPRESSION:  Normal colonoscopy.  RECOMMENDATIONS: 1. Repeat colorectal cancer screening is recommended in the next five years    unless the patient develops any abnormal symptoms in the interim. 2. Repeat guaiac testing will be done on an outpatient basis and  further    recommendations made as needed. Dictated by:   Anselmo Rod, M.D. Attending Physician:  Charna Elizabeth DD:  05/01/02 TD:  05/06/02 Job: [id number]

## 2011-03-02 NOTE — Consult Note (Signed)
NAME:  [name] [name], [name] [name] NO.:  [id number]   MEDICAL RECORD NO.:  [id number]          PATIENT TYPE:  INP   LOCATION:  4707                         FACILITY:  [facility]   PHYSICIAN:  Garrison Columbus. Haithcock, MDDATE OF BIRTH:  1946/05/08   DATE OF CONSULTATION:  DATE OF DISCHARGE:                                 CONSULTATION   CARDIOLOGY CONSULTATION   REASON FOR CONSULTATION:  Chest pain.   [name] [name] is a 64 year old obese woman with hypertension and  shortness of breath, borderline diabetes, dyslipidemia who for two weeks  now has had chest tightness and shortness of breath, both at rest as  well as on exertion.  She notes significant shortness of breath on  attempts to exercise.  The tightness which occurs substernally  occasionally radiates to the back.  She denies any syncope or near  syncope.  She denies lower extremity swelling, orthopnea, or PND.  She  has had no hemoptysis, and she denies palpitations.   PAST MEDICAL HISTORY:  1. Hypertension.  2. Dyslipidemia.  3. Borderline diabetes.   ALLERGIES:  None.   MEDICATIONS:  Celexa, Maxzide, aspirin, Prilosec, Toprol, potassium,  Requip.   FAMILY HISTORY:  Mother had an MI at age 50.  Father died of  complications secondary to a brain tumor at age 71.   REVIEW OF SYSTEMS:  Otherwise negative besides what is listed in the  history of present illness.   PHYSICAL EXAMINATION:  Temperature 98.7, pulse 84, respiratory rate 18,  blood pressure 134/74.  GENERAL:  She is an obese woman.  She is in no apparent distress.  I am  unable to reproduce her chest pain with chest palpitations.  NECK:  Supple.  Jugular venous pulsations are absent with the head of  the bed at 30 degrees.  CARDIOVASCULAR:  Regular S1, S2 with no murmurs, rubs, or gallops.  PMI  mildly displaced.  LUNGS:  Clear to auscultation bilaterally, no wheezes, rales, or  rhonchi.  SKIN:  No lesions.  ABDOMEN:  Soft, nontender, positive bowel  sounds.  EXTREMITIES:  No clubbing, cyanosis, or edema.  NEUROLOGIC:  Nonfocal.   EKG:  Rate is 57.  Rhythm is sinus.  Axis is normal.  There are  nonspecific ST changes with T wave inversions noted in aVR, III, and  lead V1.   This is a 65 year old woman with obesity who presents with chest pain  and shortness of breath.  I agree with the workup plan by medicine thus  far.  Cardiac enzymes, echocardiogram, and CT of the thorax given her  symptoms radiate to her back.  I also agree with aspirin, beta blockers,  and is ordered.  Would recommend checking lipid panel in the morning to  further stratify his risk factors.  I evaluate the patient for metabolic  syndrome.  If the enzymes are positive, will consider heart  catheterization.  If the enzymes are negative, will consider stress  test.  Will keep the patient n.p.o. regardless.   Thank you very much for this consultation.  We will follow with you.      Daniel B.  Haithcock, MD  Electronically Signed     DBH/MEDQ  D:  12/19/2006  T:  12/19/2006  Job:  [id number]

## 2011-03-02 NOTE — Cardiovascular Report (Signed)
NAME:  [name] [name], [name] [name] NO.:  [id number]   MEDICAL RECORD NO.:  [id number]          PATIENT TYPE:  INP   LOCATION:  4707                         FACILITY:  [facility]   PHYSICIAN:  Bevelyn Buckles. Bensimhon, MDDATE OF BIRTH:  Dec 13, 1945   DATE OF PROCEDURE:  12/20/2006  DATE OF DISCHARGE:                            CARDIAC CATHETERIZATION   PRIMARY CARE PHYSICIAN:  Dr. Talmadge Coventry.   CARDIOLOGIST:  Dr. Simona Huh.   PATIENT IDENTIFICATION:  [name] [name] is a 65 year old woman with  multiple cardiac risk factors including diabetes, hypertension,  hyperlipidemia and obesity.  She presented with substernal chest  pressure which was on and off.  Initial EKG had some nonspecific  changes.  Cardiac markers have been negative.  She is thus referred for  diagnostic catheterization.   PROCEDURES PERFORMED:  1. Selective coronary angiography.  2. Left heart catheterization.  3. Left ventriculogram.  4. Femoral artery Angio-Seal closure.   DESCRIPTION OF PROCEDURE:  The risks and benefits of catheterization  were explained.  Consent was signed and placed on the chart.  A 6-French  arterial sheath was placed in the right femoral artery using a modified  Seldinger technique.  Standard catheters including JL-4, JR-4 and angled  pigtail were used for the catheterization.  All catheter exchanges were  made over a wire.  There was no apparent complications.  Of note, we did  have some proximal right coronary artery spasm with a 6-French catheter.  This was exchanged for 4-French right coronary catheter which showed  resolution of the spasm.   HEMODYNAMIC RESULTS:  The central aortic pressure was 118/69 with a mean  of 92.  LV pressure was 124/10 with an EDP of 18.  There was no aortic  stenosis.   Left main was normal.   LAD was a long vessel wrapping the apex.  It gave off a large very  proximal diagonal and a smaller diagonal in the midsection.  It was  angiographically normal.   Left circumflex gave off a dual small ramus system.  There was a large  OM-1.  The small OM-2 was angiographically normal.   Right coronary artery was a large dominant vessel that gave off a large  PDA and 2 small posterolaterals.  Angiographically normal.   Left ventriculogram done in the RAO position showed good LV function, EF  of approximately 65-70%.  There was no regional wall motion  abnormalities.  No obvious mitral regurgitation, though we did not  opacify the ventricle well enough to see this completely.   ASSESSMENT:  1. Normal coronary arteries.  2. Normal left ventricular function.  3. Mild increased left ventricular filling pressures, likely      consistent with diastolic dysfunction.   PLAN:  We will continue with risk factor modification.  If her groin  remains stable, she will be able to go home this afternoon.      Bevelyn Buckles. Bensimhon, MD  Electronically Signed     DRB/MEDQ  D:  12/20/2006  T:  12/20/2006  Job:  [id number]   cc:   Talmadge Coventry, M.D.  Jonelle Sidle, MD

## 2011-03-02 NOTE — Discharge Summary (Signed)
NAME:  DEVI, HOPMAN NO.:  [id number]   MEDICAL RECORD NO.:  [id number]          PATIENT TYPE:  INP   LOCATION:  4707                         FACILITY:  [facility]   PHYSICIAN:  Lonia Blood, M.D.       DATE OF BIRTH:  1946/01/05   DATE OF ADMISSION:  12/18/2006  DATE OF DISCHARGE:  12/20/2006                               DISCHARGE SUMMARY   PRIMARY CARE PHYSICIAN:  Talmadge Coventry, M.D.   DISCHARGE DIAGNOSES:  1. Chest pain - noncardiac - unclear etiology - resolved.  2. Hypertension.  3. Hyperlipidemia.  4. Maybe impaired glucose tolerance.  5. History of benign esophageal stricture in the remote past status      post dilatation.  6. Hypokalemia on admission, resolved.  7. Probable diastolic dysfunction for cardiac catheterization.  8. Mild anemia outpatient workup as needed.  9. Gastroesophageal reflux disease.   DISCHARGE MEDICATIONS:  1. Aspirin 81 mg daily.  2. Metoprolol 25 mg daily.  3. Prilosec OTC 40 mg daily.  4. Celexa 1 tablet daily.  5. Maxzide 1 tablet daily.  6. Requip at bedtime.  7. Ambien 5 mg at bedtime as needed for sleep.   CONDITION AT DISCHARGE:  The patient was discharged in good condition.  At the time of discharge, she was alert, oriented, afebrile, without any  acute problems.  The patient will have followup with Dr. Smith Mince as  needed.   PROCEDURES DURING THIS ADMISSION:  1. On December 19, 2006, the patient underwent computer tomography scan of      her chest with intravenous contrast which was negative for      pulmonary embolus and/or aortic dissection.  2. Cardiac catheterization done by Dr. Gala Romney with the findings of      normal coronary arteries, normal left ventricular function, maybe      diastolic dysfunction.  3. Echocardiogram with findings of ejection of 60%.   CONSULTATIONS DURING THIS ADMISSION:  The patient was seen in  consultation by the [facility] group.   HISTORY AND PHYSICAL:  For  admission history and physical, refer to  dictated history and physical done by Dr. Michaelyn Barter.   HOSPITAL COURSE:  1. Chest pain.  Ms. Shur was admitted on December 19, 2006 at 3:36      a.m. with complaints of chest pain and EKG changes.  She was placed      on intravenous heparin, beta blockers, and aspirin for presumed      unstable angina.  The patient ruled out for myocardial infarction      and the next day was seen in consultation by [facility]      Group, and it was decided  that she should proceed with cardiac      catheterization.  The results of the cardiac catheterization did      not show presence of coronary artery disease.  For this reason, the      patient was advised to continue her current medications without any      changes.  The patient will continue with risk factor modification  as before.  In regards to the patient's chest pain etiology, we      have also obtained a computerized tomography scan of the chest      which ruled out the possibility of pulmonary embolus and ruled out      the possibility of aortic dissection.  The patient will continue      medical treatment for gastroesophageal reflux disease, and if the      chest pain recurs, the possibility of vasospastic angina can be      entertained.      Lonia Blood, M.D.  Electronically Signed     SL/MEDQ  D:  12/20/2006  T:  12/21/2006  Job:  [id number]

## 2011-03-02 NOTE — H&P (Signed)
NAME:  CORDA, SHUTT NO.:  [id number]   MEDICAL RECORD NO.:  [id number]          PATIENT TYPE:  EMS   LOCATION:  MAJO                         FACILITY:  [facility]   PHYSICIAN:  Bevelyn Buckles. Bensimhon, MDDATE OF BIRTH:  June 09, 1946   DATE OF ADMISSION:  12/20/2006  DATE OF DISCHARGE:  12/21/2006                              HISTORY & PHYSICAL   PRIMARY CARE PHYSICIAN:  Dr. Rise Mu Mazzocchi.   BRIEF HISTORY OF PRESENT ILLNESS:  I performed a cardiac catheterization  this morning on Ms. Brodman for chest pain.  This showed essentially  normal coronary arteries and normal LV function.  At the end of the  procedure, I closed her right femoral artery with an AngioSeal closure  device.  She was discharged home this afternoon.  While sitting on the  couch, she said she felt a pop and her leg started bleeding and soaked  her gown and blanket.  She came to the emergency room.  On arrival, she  was stable.  There was no evidence of ongoing bleeding.  The site looked  good with no significant hematoma or bruit.  I held manual pressure for  20 minutes.  We checked a CBC which showed a stable hemoglobin.  She is  thus discharged home from the emergency room with instructions to take  it easy for 48 hours and to watch the site closely.  She is to come back  to the emergency room immediately should she have significant swelling  or bleeding.  She will follow-up with [place] on an outpatient basis.      Bevelyn Buckles. Bensimhon, MD  Electronically Signed     DRB/MEDQ  D:  12/21/2006  T:  12/21/2006  Job:  [id number]

## 2011-03-02 NOTE — Procedures (Signed)
[facility]  Patient:    [name] [name], [name] [name] Visit Number: [id number] MRN: [id number]          Service Type: END Location: ENDO Attending Physician:  Charna Elizabeth Dictated by:   Anselmo Rod, M.D. Proc. Date: 05/01/02 Admit Date:  05/01/2002 Discharge Date: 05/01/2002   CC:         Talmadge Coventry, M.D.   Procedure Report  DATE OF BIRTH:  12/20/1945.  PROCEDURE:  Esophagogastroduodenoscopy.  ENDOSCOPIST:  Anselmo Rod, M.D.  INSTRUMENT USED:  Olympus video panendoscope.  INDICATION FOR PROCEDURE:  Dysphagia with epigastric pain and guaiac-positive stools in a 65 year old white female.  Rule out peptic ulcer disease, esophagitis, gastritis, etc.  PREPROCEDURE PREPARATION:  Informed consent was procured from the patient. The patient was fasted for eight hours prior to the procedure.  PREPROCEDURE PHYSICAL:  VITAL SIGNS:  The patient had stable vital signs.  NECK:  Supple.  CHEST:  Clear to auscultation.  S1, S2 regular.  ABDOMEN:  Soft with normal bowel sounds.  DESCRIPTION OF PROCEDURE:  The patient was placed in the left lateral decubitus position and sedated with 100 mg of Demerol and 10 mg of Versed intravenously.  Once the patient was adequately sedate and maintained on low-flow oxygen and continuous cardiac monitoring, the Olympus video panendoscope was advanced through the mouthpiece, over the tongue, into the esophagus under direct vision.  The entire esophagus appeared normal with no evidence of ring, stricture, masses, esophagitis, or Barretts mucosa.  The scope was then advanced to the stomach.  The entire gastric mucosa and the proximal small bowel appeared normal.  IMPRESSION:  Normal EGD.  RECOMMENDATIONS:  Proceed with a colonoscopy at this time. Dictated by:   Anselmo Rod, M.D. Attending Physician:  Charna Elizabeth DD:  05/01/02 TD:  05/06/02 Job: [id number]

## 2011-03-21 ENCOUNTER — Ambulatory Visit (HOSPITAL_COMMUNITY): Admission: RE | Admit: 2011-03-21 | Payer: BC Managed Care – PPO | Source: Ambulatory Visit

## 2011-04-04 ENCOUNTER — Ambulatory Visit (HOSPITAL_COMMUNITY)
Admission: RE | Admit: 2011-04-04 | Discharge: 2011-04-04 | Disposition: A | Discharge status: Home / Self Care | Payer: BC Managed Care – PPO | Source: Ambulatory Visit | Attending: Family Medicine | Admitting: Family Medicine

## 2011-04-04 DIAGNOSIS — Z1231 Encounter for screening mammogram for malignant neoplasm of breast: Secondary | ICD-10-CM | POA: Insufficient documentation

## 2011-05-31 ENCOUNTER — Other Ambulatory Visit: Payer: Self-pay | Admitting: Internal Medicine

## 2011-05-31 NOTE — Telephone Encounter (Signed)
Per pt call, pt said pharmacy has been trying get a refill request approved for RX all week. Please call RX request into pharmacy.

## 2011-06-04 ENCOUNTER — Ambulatory Visit: Payer: BC Managed Care – PPO | Admitting: *Deleted

## 2011-10-03 ENCOUNTER — Telehealth: Payer: Self-pay | Admitting: Internal Medicine

## 2011-10-03 MED ORDER — METOPROLOL SUCCINATE ER 100 MG PO TB24: 100.0000 mg | ORAL_TABLET | Freq: Every day | ORAL | Status: AC

## 2011-10-03 NOTE — Telephone Encounter (Signed)
New message:  Pt states that the pharmacy has requested a refill on Metoprolol with no response.  Pt. Is going out of town on Friday and this needs to be called in before she leaves.  Please call [facility] at [phone].  Please call pt when this has been done.

## 2011-10-03 NOTE — Telephone Encounter (Signed)
Informed pt that we will not be able to refill this med without a future appointment she can get it from PCP

## 2011-11-29 ENCOUNTER — Encounter (HOSPITAL_BASED_OUTPATIENT_CLINIC_OR_DEPARTMENT_OTHER): Payer: Self-pay

## 2011-11-29 ENCOUNTER — Emergency Department (INDEPENDENT_AMBULATORY_CARE_PROVIDER_SITE_OTHER): Payer: BC Managed Care – PPO

## 2011-11-29 ENCOUNTER — Emergency Department (HOSPITAL_BASED_OUTPATIENT_CLINIC_OR_DEPARTMENT_OTHER)
Admission: EM | Admit: 2011-11-29 | Discharge: 2011-11-29 | Disposition: A | Discharge status: Home / Self Care | Payer: BC Managed Care – PPO | Attending: Emergency Medicine | Admitting: Emergency Medicine

## 2011-11-29 DIAGNOSIS — R197 Diarrhea, unspecified: Secondary | ICD-10-CM

## 2011-11-29 DIAGNOSIS — D259 Leiomyoma of uterus, unspecified: Secondary | ICD-10-CM | POA: Insufficient documentation

## 2011-11-29 DIAGNOSIS — K529 Noninfective gastroenteritis and colitis, unspecified: Secondary | ICD-10-CM

## 2011-11-29 DIAGNOSIS — Z9884 Bariatric surgery status: Secondary | ICD-10-CM

## 2011-11-29 DIAGNOSIS — E079 Disorder of thyroid, unspecified: Secondary | ICD-10-CM | POA: Insufficient documentation

## 2011-11-29 DIAGNOSIS — R109 Unspecified abdominal pain: Secondary | ICD-10-CM

## 2011-11-29 DIAGNOSIS — R1110 Vomiting, unspecified: Secondary | ICD-10-CM

## 2011-11-29 DIAGNOSIS — I10 Essential (primary) hypertension: Secondary | ICD-10-CM | POA: Insufficient documentation

## 2011-11-29 DIAGNOSIS — Z79899 Other long term (current) drug therapy: Secondary | ICD-10-CM | POA: Insufficient documentation

## 2011-11-29 DIAGNOSIS — K5289 Other specified noninfective gastroenteritis and colitis: Secondary | ICD-10-CM | POA: Insufficient documentation

## 2011-11-29 DIAGNOSIS — R112 Nausea with vomiting, unspecified: Secondary | ICD-10-CM

## 2011-11-29 DIAGNOSIS — K5730 Diverticulosis of large intestine without perforation or abscess without bleeding: Secondary | ICD-10-CM | POA: Insufficient documentation

## 2011-11-29 HISTORY — DX: Essential (primary) hypertension: I10

## 2011-11-29 HISTORY — DX: Diverticulitis of intestine, part unspecified, without perforation or abscess without bleeding: K57.92

## 2011-11-29 HISTORY — DX: Disorder of thyroid, unspecified: E07.9

## 2011-11-29 LAB — COMPREHENSIVE METABOLIC PANEL
ALT: 12 U/L (ref 0–35)
AST: 17 U/L (ref 0–37)
Albumin: 3.4 g/dL — ABNORMAL LOW (ref 3.5–5.2)
Alkaline Phosphatase: 63 U/L (ref 39–117)
BUN: 16 mg/dL (ref 6–23)
CO2: 25 mEq/L (ref 19–32)
Calcium: 9.2 mg/dL (ref 8.4–10.5)
Chloride: 102 mEq/L (ref 96–112)
Creatinine, Ser: 1 mg/dL (ref 0.50–1.10)
GFR calc Af Amer: 68 mL/min — ABNORMAL LOW (ref >90)
GFR calc non Af Amer: 59 mL/min — ABNORMAL LOW (ref >90)
Glucose, Bld: 120 mg/dL — ABNORMAL HIGH (ref 70–99)
Potassium: 2.9 mEq/L — ABNORMAL LOW (ref 3.5–5.1)
Sodium: 139 mEq/L (ref 135–145)
Total Bilirubin: 0.4 mg/dL (ref 0.3–1.2)
Total Protein: 7.3 g/dL (ref 6.0–8.3)

## 2011-11-29 LAB — URINALYSIS, DIPSTICK ONLY
Glucose, UA: NEGATIVE mg/dL
Hgb urine dipstick: NEGATIVE
Ketones, ur: NEGATIVE mg/dL
Nitrite: NEGATIVE
Protein, ur: 30 mg/dL — AB
Specific Gravity, Urine: 1.02 (ref 1.005–1.030)
Urobilinogen, UA: 0.2 mg/dL (ref 0.0–1.0)
pH: 6 (ref 5.0–8.0)

## 2011-11-29 LAB — CBC
HCT: 38.1 % (ref 36.0–46.0)
Hemoglobin: 13 g/dL (ref 12.0–15.0)
MCH: 30.4 pg (ref 26.0–34.0)
MCHC: 34.1 g/dL (ref 30.0–36.0)
MCV: 89 fL (ref 78.0–100.0)
Platelets: 199 10*3/uL (ref 150–400)
RBC: 4.28 MIL/uL (ref 3.87–5.11)
RDW: 13.6 % (ref 11.5–15.5)
WBC: 4.5 10*3/uL (ref 4.0–10.5)

## 2011-11-29 LAB — LIPASE, BLOOD: Lipase: 22 U/L (ref 11–59)

## 2011-11-29 MED ORDER — IOHEXOL 300 MG/ML  SOLN
100.0000 mL | Freq: Once | INTRAMUSCULAR | Status: AC | PRN
  Administered 2011-11-29: 100 mL via INTRAVENOUS

## 2011-11-29 MED ORDER — POTASSIUM CHLORIDE CRYS ER 20 MEQ PO TBCR
40.0000 meq | EXTENDED_RELEASE_TABLET | Freq: Once | ORAL | Status: AC
  Administered 2011-11-29: 40 meq via ORAL
  Filled 2011-11-29: qty 2

## 2011-11-29 MED ORDER — SODIUM CHLORIDE 0.9 % IV SOLN
Freq: Once | INTRAVENOUS | Status: AC
  Administered 2011-11-29: 12:00:00 via INTRAVENOUS

## 2011-11-29 MED ORDER — OXYCODONE-ACETAMINOPHEN 5-325 MG PO TABS: 1.0000 | ORAL_TABLET | ORAL | Status: AC | PRN

## 2011-11-29 MED ORDER — HYDROMORPHONE HCL PF 1 MG/ML IJ SOLN
1.0000 mg | Freq: Once | INTRAMUSCULAR | Status: AC
  Administered 2011-11-29: 1 mg via INTRAVENOUS
  Filled 2011-11-29: qty 1

## 2011-11-29 MED ORDER — MORPHINE SULFATE 4 MG/ML IJ SOLN
4.0000 mg | Freq: Once | INTRAMUSCULAR | Status: AC
  Administered 2011-11-29: 4 mg via INTRAVENOUS
  Filled 2011-11-29: qty 1

## 2011-11-29 MED ORDER — IOHEXOL 300 MG/ML  SOLN: 20.0000 mL | INTRAMUSCULAR | Status: AC

## 2011-11-29 MED ORDER — ONDANSETRON HCL 4 MG/2ML IJ SOLN
4.0000 mg | Freq: Once | INTRAMUSCULAR | Status: AC
  Administered 2011-11-29: 4 mg via INTRAVENOUS
  Filled 2011-11-29: qty 2

## 2011-11-29 MED ORDER — CIPROFLOXACIN IN D5W 400 MG/200ML IV SOLN
400.0000 mg | Freq: Once | INTRAVENOUS | Status: AC
  Administered 2011-11-29: 400 mg via INTRAVENOUS
  Filled 2011-11-29: qty 200

## 2011-11-29 MED ORDER — ONDANSETRON HCL 4 MG PO TABS: 4.0000 mg | ORAL_TABLET | Freq: Four times a day (QID) | ORAL | Status: AC

## 2011-11-29 MED ORDER — SODIUM CHLORIDE 0.9 % IV BOLUS (SEPSIS)
1000.0000 mL | Freq: Once | INTRAVENOUS | Status: AC
  Administered 2011-11-29: 1000 mL via INTRAVENOUS

## 2011-11-29 MED ORDER — METRONIDAZOLE IN NACL 5-0.79 MG/ML-% IV SOLN
500.0000 mg | Freq: Once | INTRAVENOUS | Status: AC
  Administered 2011-11-29: 500 mg via INTRAVENOUS
  Filled 2011-11-29: qty 100

## 2011-11-29 NOTE — ED Notes (Signed)
Pt reports abdominal cramping, diarrhea and vomting since Monday.  This is recurrent illness.  She was seen at [facility] yesterday for same, diagnosed with diverticulitis and started on Flagyl and Cipro.  She is unable to keep meds down.

## 2011-11-29 NOTE — ED Notes (Signed)
Patient transported to CT via stretcher.

## 2011-11-29 NOTE — ED Notes (Signed)
MD at bedside. 

## 2011-11-29 NOTE — ED Provider Notes (Signed)
History    66 year old female with abdominal pain. Describes the pain as cramping. Diffuse. No appreciable exacerbating or relieving factors. Symptom onset was Monday. Associated with diarrhea and vomiting. She was seen at urgent care Center yesterday for the same complaints and diagnosed with diverticulitis. She states that no imaging was done no. Started on Cipro and Flagyl but says she has been unable to tolerate because of the vomiting.  CSN: [id number]  Arrival date & time 11/29/11  4098   First MD Initiated Contact with Patient 11/29/11 [phone]      Chief Complaint  Patient presents with  . Abdominal Pain  . Emesis  . Diarrhea    (Consider location/radiation/quality/duration/timing/severity/associated sxs/prior treatment) HPI  Past Medical History  Diagnosis Date  . Thyroid disease   . Diverticulitis   . Hypertension     Past Surgical History  Procedure Date  . Gastric bypass     No family history on file.  History  Substance Use Topics  . Smoking status: Never Smoker   . Smokeless tobacco: Never Used  . Alcohol Use: No    OB History    Grav Para Term Preterm Abortions TAB SAB Ect Mult Living                  Review of Systems  Review of symptoms negative unless otherwise noted in HPI.   Allergies  Clarithromycin  Home Medications   Current Outpatient Rx  Name Route Sig Dispense Refill  . CALCIUM CARBONATE-VITAMIN D 500-200 MG-UNIT PO TABS Oral Take 1 tablet by mouth daily.    Marland Kitchen VITAMIN D 1000 UNITS PO TABS Oral Take 1,000 Units by mouth daily.    Marland Kitchen CIPROFLOXACIN HCL 500 MG PO TABS Oral Take 500 mg by mouth 2 (two) times daily.    Marland Kitchen CITALOPRAM HYDROBROMIDE 20 MG PO TABS Oral Take 20 mg by mouth daily.    Marland Kitchen DEXAMETHASONE 0.1 % OP SUSP  1 drop 2 (two) times daily.    Marland Kitchen LEVOTHYROXINE SODIUM 100 MCG PO TABS Oral Take by mouth daily.    Marland Kitchen METOPROLOL SUCCINATE ER 100 MG PO TB24 Oral Take 100 mg by mouth daily. Take with or immediately following a meal.      . METRONIDAZOLE 500 MG PO TABS Oral Take 500 mg by mouth 3 (three) times daily.    . MULTIVITAMINS PO CAPS Oral Take 1 capsule by mouth daily.    Marland Kitchen OMEPRAZOLE 20 MG PO CPDR Oral Take 20 mg by mouth daily.    Marland Kitchen POTASSIUM CHLORIDE ER 10 MEQ PO CPCR Oral Take 10 mEq by mouth 3 (three) times daily.    . TRIAMTERENE-HCTZ 75-50 MG PO TABS Oral Take 1 tablet by mouth daily.    Marland Kitchen VITAMIN B-12 1000 MCG PO TABS Oral Take 1,000 mcg by mouth daily.    Marland Kitchen ZOLPIDEM TARTRATE 10 MG PO TABS Oral Take 10 mg by mouth at bedtime as needed.    Marland Kitchen METOPROLOL SUCCINATE ER 100 MG PO TB24 Oral Take 1 tablet (100 mg total) by mouth daily. 30 tablet 0    BP 140/69  Pulse 94  Temp(Src) 98.8 F (37.1 C) (Oral)  Resp 16  Ht 5\' 6"  (1.676 m)  Wt 229 lb (103.874 kg)  BMI 36.96 kg/m2  SpO2 96%  Physical Exam  Nursing note and vitals reviewed. Constitutional: She appears well-developed.       Sitting in bed. Mildly uncomfortable appearing. Obese.  HENT:  Head: Normocephalic and atraumatic.  Eyes: Conjunctivae are normal. Right eye exhibits no discharge. Left eye exhibits no discharge.  Neck: Neck supple.  Cardiovascular: Normal rate, regular rhythm and normal heart sounds.  Exam reveals no gallop and no friction rub.   No murmur heard. Pulmonary/Chest: Effort normal and breath sounds normal. No respiratory distress.  Abdominal: Soft. She exhibits no distension and no mass. There is tenderness. There is no rebound and no guarding.       mmild diffuse tenderness without guarding.  Genitourinary:       No costovertebral angletenderness  Musculoskeletal: She exhibits no edema and no tenderness.  Neurological: She is alert.  Skin: Skin is warm and dry.  Psychiatric: She has a normal mood and affect. Her behavior is normal. Thought content normal.    ED Course  Procedures (including critical care time)  Labs Reviewed  COMPREHENSIVE METABOLIC PANEL - Abnormal; Notable for the following:    Potassium 2.9 (*)     Glucose, Bld 120 (*)    Albumin 3.4 (*)    GFR calc non Af Amer 59 (*)    GFR calc Af Amer 68 (*)    All other components within normal limits  URINALYSIS, DIPSTICK ONLY - Abnormal; Notable for the following:    Bilirubin Urine SMALL (*)    Protein, ur 30 (*)    Leukocytes, UA TRACE (*)    All other components within normal limits  CBC  LIPASE, BLOOD   Ct Abdomen Pelvis W Contrast  11/29/2011  *RADIOLOGY REPORT*  Clinical Data: Abdominal pain.  Status post gastric bypass.  Emesis and diarrhea.  CT ABDOMEN AND PELVIS WITH CONTRAST  Technique:  Multidetector CT imaging of the abdomen and pelvis was performed following the standard protocol during bolus administration of intravenous contrast.  Contrast: OMNIPAQUE IOHEXOL 300 MG/ML IV SOLN  Comparison: CT of the abdomen and pelvis dated 12/19/2006.  Findings:  Lung Bases: Minimal dependent atelectasis in the lower lobes the lungs bilaterally.  Abdomen/Pelvis:   There are changes of gastric bypass are noted. The enhanced appearance of the liver, gallbladder, pancreas, spleen, bilateral adrenal glands and bilateral kidneys is unremarkable.  There are two small duodenal diverticula of the third portion of the duodenum incidentally noted.  No ascites or pneumoperitoneum.  No pathologic distension of bowel. The sigmoid colon and rectum are largely decompressed, however, there appears to be diffuse bowel wall thickening throughout these regions with associated hypervascularity in the mesorectum and mesocolon, suggestive of colitis.  The remainder of the colon otherwise appears relatively normal.  Multiple calcifications within the uterus, uterus, particularly in the fundal region, likely represent calcifications within fibroids.  Ovaries are unremarkable.  Urinary bladder is normal in appearance.  Musculoskeletal: There are no aggressive appearing lytic or blastic lesions noted in the visualized portions of the skeleton. Grade 1 anterolisthesis of L4 upon  L5 is noted.  As well, there are large hemangiomas in T10 and L1 vertebral bodies.  IMPRESSION: 1.  Findings, as above, suggestive of proctocolitis (with involvement of the sigmoid colon and rectum). 2.  Status post gastric bypass. 3.  Small duodenal diverticulae incidentally noted. 4.  Multiple small calcified fibroids within the uterus. 5.  Grade 1 anterolisthesis of L4 upon L5.  Original Report Authenticated By: Florencia Reasons, M.D.     1. Colitis   2. Abdominal pain   3. Nausea & vomiting   4. Diarrhea       MDM  66 year old female with abdominal pain and nausea, vomiting  and diarrhea. CT abdomen and pelvis is consistent with colitis. Patient was evaluated yesterday in urgent care for the same and started on ciprofloxacin and Flagyl. Patient states that she has been unable to tolerate these medicines orally. She has had no vomiting in the emergency room. She got a dose of IV antibiotics prior to her discharge. Return precautions were discussed. Outpatient followup        Raeford Razor, MD 12/02/11 [phone]

## 2011-11-29 NOTE — ED Notes (Signed)
Contrast provided for CT scan.  Pt and family informed of plan of care.

## 2011-12-03 ENCOUNTER — Encounter: Payer: Self-pay | Admitting: Gastroenterology

## 2011-12-25 ENCOUNTER — Ambulatory Visit (INDEPENDENT_AMBULATORY_CARE_PROVIDER_SITE_OTHER): Payer: BC Managed Care – PPO | Admitting: Gastroenterology

## 2011-12-25 ENCOUNTER — Encounter: Payer: Self-pay | Admitting: Gastroenterology

## 2011-12-25 VITALS — BP 130/80 | HR 86 | Ht 66.0 in | Wt 233.6 lb

## 2011-12-25 DIAGNOSIS — R933 Abnormal findings on diagnostic imaging of other parts of digestive tract: Secondary | ICD-10-CM

## 2011-12-25 MED ORDER — PEG-KCL-NACL-NASULF-NA ASC-C 100 G PO SOLR: 1.0000 | Freq: Once | ORAL | Status: DC

## 2011-12-25 NOTE — Patient Instructions (Signed)
You have been scheduled for a colonoscopy with propofol. Please follow written instructions given to you at your visit today.  Please pick up your prep kit at the pharmacy within the next 1-3 days.  

## 2011-12-25 NOTE — Assessment & Plan Note (Addendum)
The patient had 3 episodes of severe nausea,  Vomiting,  abdominal pain, and diarrhea with CT raising the question of colitis involving the distal colon. She  has taken antibiotics for possible diverticulitis although that was not definitively seen. Findings on CT could be related to an underdistended colon. Colitis should be ruled out.  Recommendations #1 colonoscopy

## 2011-12-25 NOTE — Progress Notes (Signed)
History of Present Illness:  [name] [name] is a 66 year old white female with history of esophageal stricture and gastric bypass surgery referred from the ER because of nausea, vomiting, and diarrhea. Since December, 2012 she's had 3 discrete episodes of severe nausea, vomiting, and diarrhea lasting 2-3 days.  In mid-February she was seen in the ER for similar symptoms. CT scan, which I reviewed, raised the question of bowel wall thickening and hypervascularity in the rectosigmoid. Lab including CBC has been unremarkable. There's no history of melena or hematochezia. With her nausea and vomiting she's had pain across her upper abdomen. In between these episodes bowel movements have been normal.    Past Medical History  Diagnosis Date  . Thyroid disease   . Diverticulitis   . Hypertension   . Diabetes mellitus   . Hypokalemia   . Hyperlipemia   . Sleep apnea   . Anemia   . GERD (gastroesophageal reflux disease)   . Stricture and stenosis of esophagus   . Cancer     basil cell  . Obesity    Past Surgical History  Procedure Date  . Gastric bypass    family history includes Brain cancer in her father; Diabetes in her son; and Heart disease in her mother. Current Outpatient Prescriptions  Medication Sig Dispense Refill  . calcium-vitamin D (OSCAL WITH D) 500-200 MG-UNIT per tablet Take 1 tablet by mouth daily.      . cholecalciferol (VITAMIN D) 1000 UNITS tablet Take 1,000 Units by mouth daily.      . ciprofloxacin (CIPRO) 500 MG tablet Take 500 mg by mouth 2 (two) times daily.      . citalopram (CELEXA) 20 MG tablet Take 20 mg by mouth daily.      Marland Kitchen HYDROcodone-acetaminophen (NORCO) 7.5-325 MG per tablet Take 1 tablet by mouth every 6 (six) hours as needed.      Marland Kitchen levothyroxine (SYNTHROID, LEVOTHROID) 100 MCG tablet Take by mouth daily.      . metoprolol succinate (TOPROL-XL) 100 MG 24 hr tablet Take 100 mg by mouth daily. Take with or immediately following a meal.      . Multiple  Vitamin (MULTIVITAMIN) capsule Take 1 capsule by mouth daily.      Marland Kitchen omeprazole (PRILOSEC) 20 MG capsule Take 20 mg by mouth daily.      . potassium chloride (MICRO-K) 10 MEQ CR capsule Take 10 mEq by mouth 3 (three) times daily.      . pramipexole (MIRAPEX) 0.25 MG tablet Take 0.25 mg by mouth 3 (three) times daily.      Marland Kitchen triamterene-hydrochlorothiazide (MAXZIDE) 75-50 MG per tablet Take 1 tablet by mouth daily.      . vitamin B-12 (CYANOCOBALAMIN) 1000 MCG tablet Take 1,000 mcg by mouth daily.      Marland Kitchen zolpidem (AMBIEN) 10 MG tablet Take 10 mg by mouth at bedtime as needed.      Marland Kitchen dexamethasone (DECADRON) 0.1 % ophthalmic suspension 1 drop 2 (two) times daily.      . metoprolol (TOPROL-XL) 100 MG 24 hr tablet Take 1 tablet (100 mg total) by mouth daily.  30 tablet  0  . metroNIDAZOLE (FLAGYL) 500 MG tablet Take 500 mg by mouth 3 (three) times daily.       Allergies as of 12/25/2011 - Review Complete 12/25/2011  Allergen Reaction Noted  . Clarithromycin  04/05/2008    reports that she has quit smoking. Her smoking use included Cigarettes. She has never used smokeless tobacco. She reports  that she does not drink alcohol or use illicit drugs.     Review of Systems: Pertinent positive and negative review of systems were noted in the above HPI section. All other review of systems were otherwise negative.  Vital signs were reviewed in today's medical record Physical Exam: General: Well developed , well nourished, no acute distress Head: Normocephalic and atraumatic Eyes:  sclerae anicteric, EOMI Ears: Normal auditory acuity Mouth: No deformity or lesions Neck: Supple, no masses or thyromegaly Lungs: Clear throughout to auscultation Heart: Regular rate and rhythm; no murmurs, rubs or bruits Abdomen: Soft, non tender and non distended. No masses, hepatosplenomegaly or hernias noted. Normal Bowel sounds Rectal:deferred Musculoskeletal: Symmetrical with no gross deformities  Skin: No  lesions on visible extremities Pulses:  Normal pulses noted Extremities: No clubbing, cyanosis, edema or deformities noted Neurological: Alert oriented x 4, grossly nonfocal Cervical Nodes:  No significant cervical adenopathy Inguinal Nodes: No significant inguinal adenopathy Psychological:  Alert and cooperative. Normal mood and affect

## 2011-12-26 ENCOUNTER — Encounter: Payer: Self-pay | Admitting: Internal Medicine

## 2011-12-31 ENCOUNTER — Ambulatory Visit (AMBULATORY_SURGERY_CENTER): Payer: BC Managed Care – PPO | Admitting: Gastroenterology

## 2011-12-31 ENCOUNTER — Encounter: Payer: Self-pay | Admitting: Gastroenterology

## 2011-12-31 VITALS — BP 171/75 | HR 56 | Temp 97.3°F | Resp 20 | Ht 66.0 in | Wt 233.0 lb

## 2011-12-31 DIAGNOSIS — D126 Benign neoplasm of colon, unspecified: Secondary | ICD-10-CM

## 2011-12-31 DIAGNOSIS — R933 Abnormal findings on diagnostic imaging of other parts of digestive tract: Secondary | ICD-10-CM

## 2011-12-31 MED ORDER — SODIUM CHLORIDE 0.9 % IV SOLN: 500.0000 mL | INTRAVENOUS | Status: DC

## 2011-12-31 NOTE — Progress Notes (Signed)
Patient did not experience any of the following events: a burn prior to discharge; a fall within the facility; wrong site/side/patient/procedure/implant event; or a hospital transfer or hospital admission upon discharge from the facility. (G8907) Patient did not have preoperative order for IV antibiotic SSI prophylaxis. (G8918)  

## 2011-12-31 NOTE — Op Note (Signed)
[facility] [address] [place], [place]  [address]  COLONOSCOPY PROCEDURE REPORT  PATIENT:  [name], [name]  MR#:  [id number] BIRTHDATE:  [birth year], 65 yrs. old  GENDER:  female ENDOSCOPIST:  Barbette Hair. Arlyce Dice, MD REF. BY: PROCEDURE DATE:  12/31/2011 PROCEDURE:  Colonoscopy with snare polypectomy, Colon with cold biopsy polypectomy ASA CLASS:  Class II INDICATIONS:  Abnormal CT of abdomen Questionable colitis rectosigmoid MEDICATIONS:   MAC sedation, administered by CRNA propofol 300mg IV  DESCRIPTION OF PROCEDURE:   After the risks benefits and alternatives of the procedure were thoroughly explained, informed consent was obtained.  Digital rectal exam was performed and revealed no abnormalities.   The LB CF-[id number] endoscope was introduced through the anus and advanced to the cecum, which was identified by both the appendix and ileocecal valve, without limitations.  The quality of the prep was excellent, using MoviPrep.  The instrument was then slowly withdrawn as the colon was fully examined. <<PROCEDUREIMAGES>>  FINDINGS:  A diminutive polyp was found in the proximal transverse colon. It was 1 mm in size. The polyp was removed using cold biopsy forceps (see image4).  A sessile polyp was found in the sigmoid colon. It was 4 mm in size. It was found 18 cm from the point of entry. Polyp was snared without cautery. Retrieval was successful (see image9). snare polyp  This was otherwise a normal examination of the colon (see image3, image6, image7, and image10).   Retroflexed views in the rectum revealed no abnormalities.    The time to cecum =  1) 3.0  minutes. The scope was then withdrawn in  1) 9.50  minutes from the cecum and the procedure completed. COMPLICATIONS:  None ENDOSCOPIC IMPRESSION: 1) 1 mm diminutive polyp in the proximal transverse colon 2) 4 mm sessile polyp in the sigmoid colon 3) Otherwise normal examination RECOMMENDATIONS: 1) If the  polyp(s) removed today are proven to be adenomatous (pre-cancerous) polyps, you will need a repeat colonoscopy in 5 years. Otherwise you should continue to follow colorectal cancer screening guidelines for "routine risk" patients with colonoscopy in 10 years. You will receive a letter within 1-2 weeks with the results of your biopsy as well as final recommendations. Please call my office if you have not received a letter after 3 weeks. REPEAT EXAM:   You will receive a letter from Dr. Arlyce Dice in 1-2 weeks, after reviewing the final pathology, with followup recommendations.  ______________________________ Barbette Hair Arlyce Dice, MD  CC:  Elpidio Anis MD  n. Rosalie DoctorBarbette Hair. Jakyron Fabro at 12/31/2011 02:06 PM  Evalee Jefferson, [phone]

## 2011-12-31 NOTE — Progress Notes (Signed)
Two unsuccessful attempts  Right medial hand 22g, right AC 22g, by w Margaurite Salido rn

## 2011-12-31 NOTE — Patient Instructions (Signed)
YOU HAD AN ENDOSCOPIC PROCEDURE TODAY AT THE [facility]: Refer to the procedure report that was given to you for any specific questions about what was found during the examination.  If the procedure report does not answer your questions, please call your gastroenterologist to clarify.  If you requested that your care partner not be given the details of your procedure findings, then the procedure report has been included in a sealed envelope for you to review at your convenience later.  YOU SHOULD EXPECT: Some feelings of bloating in the abdomen. Passage of more gas than usual.  Walking can help get rid of the air that was put into your GI tract during the procedure and reduce the bloating. If you had a lower endoscopy (such as a colonoscopy or flexible sigmoidoscopy) you may notice spotting of blood in your stool or on the toilet paper. If you underwent a bowel prep for your procedure, then you may not have a normal bowel movement for a few days.  DIET: Your first meal following the procedure should be a light meal and then it is ok to progress to your normal diet.  A half-sandwich or bowl of soup is an example of a good first meal.  Heavy or fried foods are harder to digest and may make you feel nauseous or bloated.  Likewise meals heavy in dairy and vegetables can cause extra gas to form and this can also increase the bloating.  Drink plenty of fluids but you should avoid alcoholic beverages for 24 hours.  ACTIVITY: Your care partner should take you home directly after the procedure.  You should plan to take it easy, moving slowly for the rest of the day.  You can resume normal activity the day after the procedure however you should NOT DRIVE or use heavy machinery for 24 hours (because of the sedation medicines used during the test).    SYMPTOMS TO REPORT IMMEDIATELY: A gastroenterologist can be reached at any hour.  During normal business hours, 8:30 AM to 5:00 PM Monday through Friday,  call [phone].  After hours and on weekends, please call the GI answering service at [phone] who will take a message and have the physician on call contact you.   Following lower endoscopy (colonoscopy or flexible sigmoidoscopy):  Excessive amounts of blood in the stool  Significant tenderness or worsening of abdominal pains  Swelling of the abdomen that is new, acute  Fever of 100F or higher  Following upper endoscopy (EGD)  Vomiting of blood or coffee ground material  New chest pain or pain under the shoulder blades  Painful or persistently difficult swallowing  New shortness of breath  Fever of 100F or higher  Black, tarry-looking stools  FOLLOW UP: If any biopsies were taken you will be contacted by phone or by letter within the next 1-3 weeks.  Call your gastroenterologist if you have not heard about the biopsies in 3 weeks.  Our staff will call the home number listed on your records the next business day following your procedure to check on you and address any questions or concerns that you may have at that time regarding the information given to you following your procedure. This is a courtesy call and so if there is no answer at the home number and we have not heard from you through the emergency physician on call, we will assume that you have returned to your regular daily activities without incident.  SIGNATURES/CONFIDENTIALITY: You and/or your care   partner have signed paperwork which will be entered into your electronic medical record.  These signatures attest to the fact that that the information above on your After Visit Summary has been reviewed and is understood.  Full responsibility of the confidentiality of this discharge information lies with you and/or your care-partner.   Handout on polyps 

## 2012-01-01 ENCOUNTER — Telehealth: Payer: Self-pay | Admitting: *Deleted

## 2012-01-01 NOTE — Telephone Encounter (Signed)
Left message

## 2012-01-07 ENCOUNTER — Encounter: Payer: Self-pay | Admitting: Gastroenterology

## 2012-01-09 ENCOUNTER — Encounter: Payer: Self-pay | Admitting: Gastroenterology

## 2012-03-17 ENCOUNTER — Other Ambulatory Visit (HOSPITAL_COMMUNITY): Payer: Self-pay | Admitting: Family Medicine

## 2012-03-17 DIAGNOSIS — Z1231 Encounter for screening mammogram for malignant neoplasm of breast: Secondary | ICD-10-CM

## 2012-03-17 DIAGNOSIS — N959 Unspecified menopausal and perimenopausal disorder: Secondary | ICD-10-CM

## 2012-04-08 ENCOUNTER — Other Ambulatory Visit (HOSPITAL_COMMUNITY): Payer: Self-pay | Admitting: Family Medicine

## 2012-04-08 DIAGNOSIS — N959 Unspecified menopausal and perimenopausal disorder: Secondary | ICD-10-CM

## 2012-04-08 DIAGNOSIS — Z1231 Encounter for screening mammogram for malignant neoplasm of breast: Secondary | ICD-10-CM

## 2012-04-09 ENCOUNTER — Inpatient Hospital Stay (HOSPITAL_COMMUNITY): Admission: RE | Admit: 2012-04-09 | Payer: BC Managed Care – PPO | Source: Ambulatory Visit

## 2012-04-09 ENCOUNTER — Ambulatory Visit (HOSPITAL_COMMUNITY): Admission: RE | Admit: 2012-04-09 | Payer: BC Managed Care – PPO | Source: Ambulatory Visit

## 2012-05-12 ENCOUNTER — Other Ambulatory Visit (HOSPITAL_COMMUNITY): Payer: Self-pay | Admitting: Family Medicine

## 2012-05-16 ENCOUNTER — Other Ambulatory Visit (HOSPITAL_COMMUNITY): Payer: BC Managed Care – PPO

## 2012-05-16 ENCOUNTER — Ambulatory Visit (HOSPITAL_COMMUNITY): Admission: RE | Admit: 2012-05-16 | Payer: BC Managed Care – PPO | Source: Ambulatory Visit

## 2012-06-03 ENCOUNTER — Ambulatory Visit (HOSPITAL_COMMUNITY)
Admission: RE | Admit: 2012-06-03 | Discharge: 2012-06-03 | Disposition: A | Discharge status: Home / Self Care | Payer: BC Managed Care – PPO | Source: Ambulatory Visit | Attending: Family Medicine | Admitting: Family Medicine

## 2012-06-03 DIAGNOSIS — Z1231 Encounter for screening mammogram for malignant neoplasm of breast: Secondary | ICD-10-CM | POA: Insufficient documentation

## 2012-06-03 DIAGNOSIS — Z13820 Encounter for screening for osteoporosis: Secondary | ICD-10-CM | POA: Insufficient documentation

## 2012-06-03 DIAGNOSIS — N959 Unspecified menopausal and perimenopausal disorder: Secondary | ICD-10-CM | POA: Insufficient documentation

## 2012-07-11 ENCOUNTER — Other Ambulatory Visit: Payer: Self-pay | Admitting: Otolaryngology

## 2012-07-29 ENCOUNTER — Encounter (HOSPITAL_BASED_OUTPATIENT_CLINIC_OR_DEPARTMENT_OTHER): Payer: Self-pay | Admitting: Emergency Medicine

## 2012-07-29 ENCOUNTER — Emergency Department (HOSPITAL_BASED_OUTPATIENT_CLINIC_OR_DEPARTMENT_OTHER): Payer: BC Managed Care – PPO

## 2012-07-29 ENCOUNTER — Observation Stay (HOSPITAL_BASED_OUTPATIENT_CLINIC_OR_DEPARTMENT_OTHER)
Admission: EM | Admit: 2012-07-29 | Discharge: 2012-07-30 | Disposition: A | Discharge status: Home / Self Care | Payer: BC Managed Care – PPO | Attending: Emergency Medicine | Admitting: Emergency Medicine

## 2012-07-29 DIAGNOSIS — E785 Hyperlipidemia, unspecified: Secondary | ICD-10-CM | POA: Insufficient documentation

## 2012-07-29 DIAGNOSIS — K219 Gastro-esophageal reflux disease without esophagitis: Secondary | ICD-10-CM | POA: Insufficient documentation

## 2012-07-29 DIAGNOSIS — I10 Essential (primary) hypertension: Secondary | ICD-10-CM | POA: Insufficient documentation

## 2012-07-29 DIAGNOSIS — G4730 Sleep apnea, unspecified: Secondary | ICD-10-CM | POA: Insufficient documentation

## 2012-07-29 DIAGNOSIS — E119 Type 2 diabetes mellitus without complications: Secondary | ICD-10-CM | POA: Insufficient documentation

## 2012-07-29 DIAGNOSIS — R079 Chest pain, unspecified: Principal | ICD-10-CM | POA: Insufficient documentation

## 2012-07-29 DIAGNOSIS — E669 Obesity, unspecified: Secondary | ICD-10-CM | POA: Insufficient documentation

## 2012-07-29 LAB — COMPREHENSIVE METABOLIC PANEL
ALT: 15 U/L (ref 0–35)
AST: 19 U/L (ref 0–37)
Alkaline Phosphatase: 63 U/L (ref 39–117)
CO2: 23 mEq/L (ref 19–32)
GFR calc Af Amer: 45 mL/min — ABNORMAL LOW (ref >90)
GFR calc non Af Amer: 39 mL/min — ABNORMAL LOW (ref >90)
Glucose, Bld: 107 mg/dL — ABNORMAL HIGH (ref 70–99)
Potassium: 3.9 mEq/L (ref 3.5–5.1)
Sodium: 140 mEq/L (ref 135–145)
Total Protein: 7.3 g/dL (ref 6.0–8.3)

## 2012-07-29 LAB — CBC WITH DIFFERENTIAL/PLATELET
Basophils Absolute: 0 10*3/uL (ref 0.0–0.1)
Lymphocytes Relative: 25 % (ref 12–46)
Lymphs Abs: 1.7 10*3/uL (ref 0.7–4.0)
Neutrophils Relative %: 64 % (ref 43–77)
Platelets: 265 10*3/uL (ref 150–400)
RBC: 3.98 MIL/uL (ref 3.87–5.11)
RDW: 13.5 % (ref 11.5–15.5)
WBC: 6.8 10*3/uL (ref 4.0–10.5)

## 2012-07-29 MED ORDER — ONDANSETRON HCL 4 MG/2ML IJ SOLN
4.0000 mg | Freq: Once | INTRAMUSCULAR | Status: AC
  Administered 2012-07-29: 4 mg via INTRAVENOUS
  Filled 2012-07-29: qty 2

## 2012-07-29 MED ORDER — HYDROMORPHONE HCL PF 1 MG/ML IJ SOLN
1.0000 mg | Freq: Once | INTRAMUSCULAR | Status: AC
  Administered 2012-07-29: 1 mg via INTRAVENOUS

## 2012-07-29 MED ORDER — HYDROMORPHONE HCL PF 1 MG/ML IJ SOLN
INTRAMUSCULAR | Status: AC
  Administered 2012-07-29: 1 mg via INTRAVENOUS
  Filled 2012-07-29: qty 1

## 2012-07-29 NOTE — ED Notes (Signed)
Pt reporting nausea, she also states that her arms and legs have been aching all day

## 2012-07-29 NOTE — ED Notes (Signed)
Pt have short episodes of chest pain , burnign pain that starts in right chest and radiates to jaw and back of throat

## 2012-07-29 NOTE — ED Notes (Signed)
Pt reports on/off right sided chest pain that radiates to jaw and back of throat, pt has been having chronic headaches since June, has had 11 teeth pulled, has had sinus scraped out, with no relief in headache, chest pressure/pain started this am, it comes and goes, pt does report some feeling of sob

## 2012-07-30 DIAGNOSIS — R072 Precordial pain: Secondary | ICD-10-CM

## 2012-07-30 LAB — POCT I-STAT TROPONIN I
Troponin i, poc: 0 ng/mL (ref 0.00–0.08)
Troponin i, poc: 0.01 ng/mL (ref 0.00–0.08)

## 2012-07-30 LAB — GLUCOSE, CAPILLARY: Glucose-Capillary: 118 mg/dL — ABNORMAL HIGH (ref 70–99)

## 2012-07-30 MED ORDER — HYDROMORPHONE HCL PF 1 MG/ML IJ SOLN
1.0000 mg | Freq: Once | INTRAMUSCULAR | Status: AC
  Administered 2012-07-30: 1 mg via INTRAVENOUS
  Filled 2012-07-30: qty 1

## 2012-07-30 MED ORDER — ONDANSETRON HCL 4 MG/2ML IJ SOLN
4.0000 mg | Freq: Once | INTRAMUSCULAR | Status: AC
  Administered 2012-07-30: 4 mg via INTRAVENOUS
  Filled 2012-07-30: qty 2

## 2012-07-30 MED ORDER — HYDROMORPHONE HCL PF 1 MG/ML IJ SOLN
0.5000 mg | Freq: Once | INTRAMUSCULAR | Status: AC
  Administered 2012-07-30: 0.5 mg via INTRAVENOUS

## 2012-07-30 MED ORDER — HYDROMORPHONE HCL PF 1 MG/ML IJ SOLN
INTRAMUSCULAR | Status: AC
  Filled 2012-07-30: qty 1

## 2012-07-30 NOTE — ED Notes (Signed)
Report given to [facility]link 

## 2012-07-30 NOTE — ED Provider Notes (Signed)
HADEN CAVENAUGH is a 66 y.o. female  sent to CDU on chest pain protocol from pod A. Sign out from Dr. Norlene Campbell as follows:  Was transferred from [facility] seen by Dr. Anitra Lauth she is pending stress echo in the morning.  0730 patient seen and examined resting comfortably. She states she has no chest pain at this time. She denies any shortness of breath nausea or vomiting. Lung sounds are clear to auscultation bilaterally heart is regular rate and rhythm without murmurs rubs and gallops. Abdominal exam is benign with no peritoneal signs or tenderness to palpation.  Verbal report from Dr. Patty Sermons appreciated: Patient has normal stress echo. Discussed results with attending Dr. Karma Ganja who agrees with care plan to discharge her and she is chest pain-free at this time with primary care and cardiology followup.   Pt verbalized understanding and agrees with care plan. Outpatient follow-up and return precautions given.      Wynetta Emery, PA-C 07/30/12 1204

## 2012-07-30 NOTE — Progress Notes (Signed)
  Echocardiogram 2D Echocardiogram has been performed.  [name] [name] 07/30/2012, 10:07 AM

## 2012-07-30 NOTE — ED Provider Notes (Signed)
Medical screening examination/treatment/procedure(s) were performed by non-physician practitioner and as supervising physician I was immediately available for consultation/collaboration.  Derwood Kaplan, MD 07/30/12 1657

## 2012-07-30 NOTE — ED Notes (Signed)
NPO maintained.

## 2012-07-30 NOTE — ED Provider Notes (Addendum)
History     CSN: [id number]  Arrival date & time 07/29/12  2239   First MD Initiated Contact with Patient 07/29/12 2300      Chief Complaint  Patient presents with  . Chest Pain    (Consider location/radiation/quality/duration/timing/severity/associated sxs/prior treatment) Patient is a 66 y.o. female presenting with chest pain. The history is provided by the patient.  Chest Pain The chest pain began 12 - 24 hours ago. Duration of episode(s) is 10 minutes. Chest pain occurs intermittently. The chest pain is resolved. Associated with: all episodes started while sitting and had no associated sx. At its most intense, the pain is at 6/10. The pain is currently at 0/10. The severity of the pain is moderate. The quality of the pain is described as tightness and squeezing. The pain radiates to the right neck (throat). Pertinent negatives for primary symptoms include no fever, no syncope, no shortness of breath, no cough, no wheezing, no abdominal pain, no nausea and no vomiting.  Pertinent negatives for associated symptoms include no diaphoresis, no lower extremity edema, no near-syncope and no weakness. She tried nothing for the symptoms. Risk factors include obesity.  Her past medical history is significant for hyperlipidemia and hypertension.  Pertinent negatives for past medical history include no CAD, no diabetes and no MI.  Her family medical history is significant for CAD in family. Family history comments: mom died of MI in her 10's  Procedure history is negative for cardiac catheterization and echocardiogram.     Past Medical History  Diagnosis Date  . Thyroid disease   . Diverticulitis   . Hypertension   . Diabetes mellitus   . Hypokalemia   . Hyperlipemia   . Sleep apnea   . Anemia   . GERD (gastroesophageal reflux disease)   . Stricture and stenosis of esophagus   . Cancer     basil cell  . Obesity     Past Surgical History  Procedure Date  . Gastric bypass      Family History  Problem Relation Age of Onset  . Brain cancer Father   . Diabetes Son   . Heart disease Mother   . Colon cancer Neg Hx     History  Substance Use Topics  . Smoking status: Former Smoker    Types: Cigarettes  . Smokeless tobacco: Never Used  . Alcohol Use: No    OB History    Grav Para Term Preterm Abortions TAB SAB Ect Mult Living                  Review of Systems  Constitutional: Negative for fever and diaphoresis.  Respiratory: Negative for cough, shortness of breath and wheezing.   Cardiovascular: Positive for chest pain. Negative for syncope and near-syncope.  Gastrointestinal: Negative for nausea, vomiting and abdominal pain.  Neurological: Positive for headaches. Negative for weakness.       Patient states she's having headache for the last 4 months. She's had 4 courses of antibiotics, her sinuses scraped does not have any relief in headache  All other systems reviewed and are negative.    Allergies  Clarithromycin  Home Medications   Current Outpatient Rx  Name Route Sig Dispense Refill  . CALCIUM CARBONATE-VITAMIN D 500-200 MG-UNIT PO TABS Oral Take 1 tablet by mouth daily.    Marland Kitchen VITAMIN D 1000 UNITS PO TABS Oral Take 1,000 Units by mouth daily.    Marland Kitchen CITALOPRAM HYDROBROMIDE 20 MG PO TABS Oral Take 20 mg by  mouth daily.    Marland Kitchen HYDROCODONE-ACETAMINOPHEN 7.5-325 MG PO TABS Oral Take 1 tablet by mouth every 6 (six) hours as needed.    Marland Kitchen LEVOTHYROXINE SODIUM 100 MCG PO TABS Oral Take by mouth daily.    Marland Kitchen METOPROLOL SUCCINATE ER 100 MG PO TB24 Oral Take 1 tablet (100 mg total) by mouth daily. 30 tablet 0  . MULTIVITAMINS PO CAPS Oral Take 1 capsule by mouth daily.    Marland Kitchen OMEPRAZOLE 20 MG PO CPDR Oral Take 20 mg by mouth daily.    Marland Kitchen POTASSIUM CHLORIDE ER 10 MEQ PO CPCR Oral Take 10 mEq by mouth 3 (three) times daily.    Marland Kitchen PRAMIPEXOLE DIHYDROCHLORIDE 0.25 MG PO TABS Oral Take 0.25 mg by mouth 3 (three) times daily.    . TRIAMTERENE-HCTZ 75-50 MG PO  TABS Oral Take 1 tablet by mouth daily.    Marland Kitchen VITAMIN B-12 1000 MCG PO TABS Oral Take 1,000 mcg by mouth daily.    Marland Kitchen ZOLPIDEM TARTRATE 10 MG PO TABS Oral Take 10 mg by mouth at bedtime as needed.      BP 135/93  Pulse 62  Temp 98.6 F (37 C) (Oral)  Resp 16  Wt 220 lb (99.791 kg)  SpO2 96%  Physical Exam  Nursing note and vitals reviewed. Constitutional: She is oriented to person, place, and time. She appears well-developed and well-nourished. No distress.  HENT:  Head: Normocephalic and atraumatic.  Mouth/Throat: Oropharynx is clear and moist.  Eyes: Conjunctivae normal and EOM are normal. Pupils are equal, round, and reactive to light.  Neck: Normal range of motion. Neck supple.  Cardiovascular: Normal rate, regular rhythm and intact distal pulses.   No murmur heard. Pulmonary/Chest: Effort normal and breath sounds normal. No respiratory distress. She has no wheezes. She has no rales.  Abdominal: Soft. She exhibits no distension. There is no tenderness. There is no rebound and no guarding.  Musculoskeletal: Normal range of motion. She exhibits no edema and no tenderness.  Neurological: She is alert and oriented to person, place, and time. She has normal strength. No cranial nerve deficit or sensory deficit. Coordination normal.  Skin: Skin is warm and dry. No rash noted. No erythema.  Psychiatric: She has a normal mood and affect. Her behavior is normal.    ED Course  Procedures (including critical care time)  Labs Reviewed  CBC WITH DIFFERENTIAL - Abnormal; Notable for the following:    Hemoglobin 11.9 (*)     All other components within normal limits  COMPREHENSIVE METABOLIC PANEL - Abnormal; Notable for the following:    Glucose, Bld 107 (*)     BUN 32 (*)     Creatinine, Ser 1.40 (*)     GFR calc non Af Amer 39 (*)     GFR calc Af Amer 45 (*)     All other components within normal limits  TROPONIN I  CK   Dg Chest Port 1 View  07/29/2012  *RADIOLOGY REPORT*   Clinical Data: Chest pain.  PORTABLE CHEST - 1 VIEW  Comparison: 03/10/2009  Findings: Shallow inspiration. The heart size and pulmonary vascularity are normal. The lungs appear clear and expanded without focal air space disease or consolidation. No blunting of the costophrenic angles.  No pneumothorax.  Mediastinal contours appear intact.  No significant change since previous study.  IMPRESSION: No evidence of active pulmonary disease.   Original Report Authenticated By: Marlon Pel, M.D.     Date: 07/30/2012  Rate: 56  Rhythm: sinus bradycardia  QRS Axis: normal  Intervals: normal  ST/T Wave abnormalities: nonspecific T wave changes  Conduction Disutrbances:none  Narrative Interpretation:   Old EKG Reviewed: unchanged    1. Chest pain at rest       MDM   Pt with atypical story for CP with 3 episodes today at rest that lasted 5-21min and resolved.  TIMI 1 for risk factors (HTN, HLD, family hx) and no associated sx.  Sx are not suggestive of PE, however could be GI related.  Pt has hx of GERD and esophageal stricture and recently has been on multiple abx, sinuses scraped, etc for persistent HA for the last 4 months which could exacerbate GERD.  EKG unchanged from prior.  CXR, CBC, BMP, CE wnl. Discussed with pt and she did not received ASA due to gastric bypass and she was told not to take it due to stomach ulcers. Pt has been pain free since arrival.  With all labs normal and normal VS and exam will place in CP obs unit for stress echo due to BMI of 36.  [facility].        Gwyneth Sprout, MD 07/30/12 6213  Gwyneth Sprout, MD 07/30/12 [phone]

## 2012-07-30 NOTE — ED Notes (Signed)
Headache has improved. Pain is decreased

## 2012-07-30 NOTE — ED Notes (Signed)
Pt. Denies any chest pain or sob.  Pt. Does report having a headache that has began in June

## 2013-03-01 ENCOUNTER — Emergency Department (HOSPITAL_BASED_OUTPATIENT_CLINIC_OR_DEPARTMENT_OTHER)
Admission: EM | Admit: 2013-03-01 | Discharge: 2013-03-02 | Disposition: A | Discharge status: Home / Self Care | Payer: No Typology Code available for payment source | Attending: Emergency Medicine | Admitting: Emergency Medicine

## 2013-03-01 ENCOUNTER — Encounter (HOSPITAL_BASED_OUTPATIENT_CLINIC_OR_DEPARTMENT_OTHER): Payer: Self-pay | Admitting: *Deleted

## 2013-03-01 DIAGNOSIS — Z87891 Personal history of nicotine dependence: Secondary | ICD-10-CM | POA: Insufficient documentation

## 2013-03-01 DIAGNOSIS — E119 Type 2 diabetes mellitus without complications: Secondary | ICD-10-CM | POA: Insufficient documentation

## 2013-03-01 DIAGNOSIS — K219 Gastro-esophageal reflux disease without esophagitis: Secondary | ICD-10-CM | POA: Insufficient documentation

## 2013-03-01 DIAGNOSIS — E669 Obesity, unspecified: Secondary | ICD-10-CM | POA: Insufficient documentation

## 2013-03-01 DIAGNOSIS — G43909 Migraine, unspecified, not intractable, without status migrainosus: Secondary | ICD-10-CM | POA: Insufficient documentation

## 2013-03-01 DIAGNOSIS — Z862 Personal history of diseases of the blood and blood-forming organs and certain disorders involving the immune mechanism: Secondary | ICD-10-CM | POA: Insufficient documentation

## 2013-03-01 DIAGNOSIS — Z8589 Personal history of malignant neoplasm of other organs and systems: Secondary | ICD-10-CM | POA: Insufficient documentation

## 2013-03-01 DIAGNOSIS — Z79899 Other long term (current) drug therapy: Secondary | ICD-10-CM | POA: Insufficient documentation

## 2013-03-01 DIAGNOSIS — Z8639 Personal history of other endocrine, nutritional and metabolic disease: Secondary | ICD-10-CM | POA: Insufficient documentation

## 2013-03-01 DIAGNOSIS — Z8719 Personal history of other diseases of the digestive system: Secondary | ICD-10-CM | POA: Insufficient documentation

## 2013-03-01 DIAGNOSIS — I10 Essential (primary) hypertension: Secondary | ICD-10-CM | POA: Insufficient documentation

## 2013-03-01 DIAGNOSIS — E079 Disorder of thyroid, unspecified: Secondary | ICD-10-CM | POA: Insufficient documentation

## 2013-03-01 DIAGNOSIS — K222 Esophageal obstruction: Secondary | ICD-10-CM | POA: Insufficient documentation

## 2013-03-01 MED ORDER — PREDNISONE 10 MG PO TABS
60.0000 mg | ORAL_TABLET | Freq: Once | ORAL | Status: AC
  Administered 2013-03-01: 60 mg via ORAL
  Filled 2013-03-01: qty 1

## 2013-03-01 MED ORDER — DEXAMETHASONE SODIUM PHOSPHATE 10 MG/ML IJ SOLN: 8.0000 mg | Freq: Once | INTRAMUSCULAR | Status: DC

## 2013-03-01 MED ORDER — KETOROLAC TROMETHAMINE 30 MG/ML IJ SOLN
30.0000 mg | Freq: Once | INTRAMUSCULAR | Status: AC
  Administered 2013-03-01: 30 mg via INTRAVENOUS
  Filled 2013-03-01: qty 1

## 2013-03-01 MED ORDER — METOCLOPRAMIDE HCL 5 MG/ML IJ SOLN
10.0000 mg | Freq: Once | INTRAMUSCULAR | Status: AC
  Administered 2013-03-01: 10 mg via INTRAVENOUS
  Filled 2013-03-01: qty 2

## 2013-03-01 MED ORDER — SODIUM CHLORIDE 0.9 % IV BOLUS (SEPSIS)
1000.0000 mL | Freq: Once | INTRAVENOUS | Status: AC
  Administered 2013-03-01: 1000 mL via INTRAVENOUS

## 2013-03-01 MED ORDER — DIPHENHYDRAMINE HCL 50 MG/ML IJ SOLN
25.0000 mg | Freq: Once | INTRAMUSCULAR | Status: AC
  Administered 2013-03-01: 25 mg via INTRAVENOUS
  Filled 2013-03-01: qty 1

## 2013-03-01 NOTE — ED Notes (Signed)
Pt reports onset of migraine HA that began today, pt also admits to nausea, denies vomiting. Pt admits to chronic hx of migraines - states she has one almost every day. Pt A&Ox4, in no acute distress - pleasant and cooperative. Warm blankets given and pt's family at bedside provided w/ coffee.

## 2013-03-01 NOTE — ED Provider Notes (Signed)
Medical screening examination/treatment/procedure(s) were performed by non-physician practitioner and as supervising physician I was immediately available for consultation/collaboration.   Charles B. Bernette Mayers, MD 03/01/13 [phone]

## 2013-03-01 NOTE — ED Provider Notes (Signed)
History     CSN: [id number]  Arrival date & time 03/01/13  2059   First MD Initiated Contact with Patient 03/01/13 2232      Chief Complaint  Patient presents with  . Migraine    (Consider location/radiation/quality/duration/timing/severity/associated sxs/prior treatment) HPI Patient presents emergency department with a migraine headache that began earlier today.  Patient, states she's had migraine headache history patient, states she did not take anything prior to arrival for her headache.  Patient denies visual changes, nausea, vomiting, abdominal pain, chest pain, shortness breath, fever, dizziness, or syncope.  Patient, states, that she does have sensitivity to light and sound.  Patient, states, that nothing seems to make her headache, better    Past Medical History  Diagnosis Date  . Thyroid disease   . Diverticulitis   . Hypertension   . Diabetes mellitus   . Hypokalemia   . Hyperlipemia   . Sleep apnea   . Anemia   . GERD (gastroesophageal reflux disease)   . Stricture and stenosis of esophagus   . Cancer     basil cell  . Obesity     Past Surgical History  Procedure Laterality Date  . Gastric bypass      Family History  Problem Relation Age of Onset  . Brain cancer Father   . Diabetes Son   . Heart disease Mother   . Colon cancer Neg Hx     History  Substance Use Topics  . Smoking status: Former Smoker    Types: Cigarettes  . Smokeless tobacco: Never Used  . Alcohol Use: No    OB History   Grav Para Term Preterm Abortions TAB SAB Ect Mult Living                  Review of Systems All other systems negative except as documented in the HPI. All pertinent positives and negatives as reviewed in the HPI. Allergies  Clarithromycin  Home Medications   Current Outpatient Rx  Name  Route  Sig  Dispense  Refill  . calcium-vitamin D (OSCAL WITH D) 500-200 MG-UNIT per tablet   Oral   Take 1 tablet by mouth daily.         . cholecalciferol  (VITAMIN D) 1000 UNITS tablet   Oral   Take 1,000 Units by mouth daily.         . citalopram (CELEXA) 20 MG tablet   Oral   Take 20 mg by mouth daily.         Marland Kitchen HYDROcodone-acetaminophen (NORCO) 7.5-325 MG per tablet   Oral   Take 1 tablet by mouth every 6 (six) hours as needed. For pain         . levothyroxine (SYNTHROID, LEVOTHROID) 100 MCG tablet   Oral   Take by mouth daily.         . metoprolol (TOPROL-XL) 100 MG 24 hr tablet   Oral   Take 1 tablet (100 mg total) by mouth daily.   30 tablet   0   . Multiple Vitamin (MULTIVITAMIN) capsule   Oral   Take 1 capsule by mouth daily.         Marland Kitchen omeprazole (PRILOSEC) 20 MG capsule   Oral   Take 20 mg by mouth daily.         . potassium chloride (MICRO-K) 10 MEQ CR capsule   Oral   Take 10 mEq by mouth 3 (three) times daily.         Marland Kitchen  pramipexole (MIRAPEX) 0.25 MG tablet   Oral   Take 0.25 mg by mouth daily.          Marland Kitchen triamterene-hydrochlorothiazide (MAXZIDE) 75-50 MG per tablet   Oral   Take 1 tablet by mouth daily.         . vitamin B-12 (CYANOCOBALAMIN) 1000 MCG tablet   Oral   Take 1,000 mcg by mouth daily.         Marland Kitchen zolpidem (AMBIEN) 10 MG tablet   Oral   Take 10 mg by mouth at bedtime as needed. For sleep           BP 121/83  Pulse 88  Temp(Src) 98 F (36.7 C) (Oral)  Resp 18  Ht 5\' 6"  (1.676 m)  Wt 226 lb (102.513 kg)  BMI 36.49 kg/m2  SpO2 95%  Physical Exam  Nursing note and vitals reviewed. Constitutional: She is oriented to person, place, and time. She appears well-developed and well-nourished.  HENT:  Head: Normocephalic and atraumatic.  Mouth/Throat: Oropharynx is clear and moist.  Eyes: Pupils are equal, round, and reactive to light.  Cardiovascular: Normal rate, regular rhythm and normal heart sounds.  Exam reveals no gallop.   No murmur heard. Pulmonary/Chest: Effort normal and breath sounds normal.  Neurological: She is alert and oriented to person, place, and  time. She exhibits normal muscle tone. Coordination normal.  Skin: Skin is warm and dry. No rash noted.    ED Course  Procedures (including critical care time)  Labs Reviewed  GLUCOSE, CAPILLARY - Abnormal; Notable for the following:    Glucose-Capillary 153 (*)    All other components within normal limits   patient be given IV fluids and a migraine cocktail of Toradol, Reglan and Benadryl.  Patient be advised followup with her primary care Dr. for further evaluation and recheck.  Told to return here as needed    MDM           Carlyle Dolly, PA-C 03/01/13 2337

## 2013-03-01 NOTE — ED Notes (Signed)
Pt states she has had migraines x 1 yr. This one started this a.m. +nausea. Unable to sleep. PERRL.

## 2013-03-25 ENCOUNTER — Encounter (HOSPITAL_BASED_OUTPATIENT_CLINIC_OR_DEPARTMENT_OTHER): Payer: Self-pay | Admitting: *Deleted

## 2013-03-25 ENCOUNTER — Emergency Department (HOSPITAL_BASED_OUTPATIENT_CLINIC_OR_DEPARTMENT_OTHER)
Admission: EM | Admit: 2013-03-25 | Discharge: 2013-03-25 | Disposition: A | Discharge status: Home / Self Care | Payer: No Typology Code available for payment source | Attending: Emergency Medicine | Admitting: Emergency Medicine

## 2013-03-25 DIAGNOSIS — E785 Hyperlipidemia, unspecified: Secondary | ICD-10-CM | POA: Insufficient documentation

## 2013-03-25 DIAGNOSIS — G4730 Sleep apnea, unspecified: Secondary | ICD-10-CM | POA: Insufficient documentation

## 2013-03-25 DIAGNOSIS — Z85828 Personal history of other malignant neoplasm of skin: Secondary | ICD-10-CM | POA: Insufficient documentation

## 2013-03-25 DIAGNOSIS — Z8719 Personal history of other diseases of the digestive system: Secondary | ICD-10-CM | POA: Insufficient documentation

## 2013-03-25 DIAGNOSIS — Z862 Personal history of diseases of the blood and blood-forming organs and certain disorders involving the immune mechanism: Secondary | ICD-10-CM | POA: Insufficient documentation

## 2013-03-25 DIAGNOSIS — Z9884 Bariatric surgery status: Secondary | ICD-10-CM | POA: Insufficient documentation

## 2013-03-25 DIAGNOSIS — E119 Type 2 diabetes mellitus without complications: Secondary | ICD-10-CM | POA: Insufficient documentation

## 2013-03-25 DIAGNOSIS — D649 Anemia, unspecified: Secondary | ICD-10-CM | POA: Insufficient documentation

## 2013-03-25 DIAGNOSIS — Z8639 Personal history of other endocrine, nutritional and metabolic disease: Secondary | ICD-10-CM | POA: Insufficient documentation

## 2013-03-25 DIAGNOSIS — Z87891 Personal history of nicotine dependence: Secondary | ICD-10-CM | POA: Insufficient documentation

## 2013-03-25 DIAGNOSIS — K219 Gastro-esophageal reflux disease without esophagitis: Secondary | ICD-10-CM | POA: Insufficient documentation

## 2013-03-25 DIAGNOSIS — E079 Disorder of thyroid, unspecified: Secondary | ICD-10-CM | POA: Insufficient documentation

## 2013-03-25 DIAGNOSIS — Z79899 Other long term (current) drug therapy: Secondary | ICD-10-CM | POA: Insufficient documentation

## 2013-03-25 DIAGNOSIS — I10 Essential (primary) hypertension: Secondary | ICD-10-CM | POA: Insufficient documentation

## 2013-03-25 DIAGNOSIS — R112 Nausea with vomiting, unspecified: Secondary | ICD-10-CM | POA: Insufficient documentation

## 2013-03-25 DIAGNOSIS — G43909 Migraine, unspecified, not intractable, without status migrainosus: Secondary | ICD-10-CM | POA: Insufficient documentation

## 2013-03-25 MED ORDER — METOCLOPRAMIDE HCL 5 MG/ML IJ SOLN
10.0000 mg | Freq: Once | INTRAMUSCULAR | Status: AC
  Administered 2013-03-25: 10 mg via INTRAVENOUS
  Filled 2013-03-25: qty 2

## 2013-03-25 MED ORDER — DIPHENHYDRAMINE HCL 50 MG/ML IJ SOLN
25.0000 mg | Freq: Once | INTRAMUSCULAR | Status: AC
  Administered 2013-03-25: 25 mg via INTRAVENOUS
  Filled 2013-03-25: qty 1

## 2013-03-25 MED ORDER — ONDANSETRON HCL 4 MG PO TABS: 4.0000 mg | ORAL_TABLET | Freq: Four times a day (QID) | ORAL | Status: AC

## 2013-03-25 MED ORDER — SODIUM CHLORIDE 0.9 % IV BOLUS (SEPSIS)
1000.0000 mL | Freq: Once | INTRAVENOUS | Status: AC
  Administered 2013-03-25: 1000 mL via INTRAVENOUS

## 2013-03-25 MED ORDER — KETOROLAC TROMETHAMINE 30 MG/ML IJ SOLN
30.0000 mg | Freq: Once | INTRAMUSCULAR | Status: AC
  Administered 2013-03-25: 30 mg via INTRAVENOUS
  Filled 2013-03-25: qty 1

## 2013-03-25 NOTE — ED Notes (Signed)
Pt to room 11 in w/c. Reports her typical migraine sx x 24 hours, nausea, vomiting, sensitivity to lights, sounds and smells.

## 2013-03-25 NOTE — ED Provider Notes (Signed)
History     CSN: [id number]  Arrival date & time 03/25/13  4098   First MD Initiated Contact with Patient 03/25/13 [phone]      Chief Complaint  Patient presents with  . Migraine    (Consider location/radiation/quality/duration/timing/severity/associated sxs/prior treatment) HPI Pt presents with c/o headache similar to her prior migraines.  She states hse has chronic migraines and always keeps some level of headache, but the headache became worse approx 24 hours ago.  Last meds at home were 2pm yesterday due to nausea and vomiting.  Headache is constant and throbbing and located diffusely over the top and front of her head.  No fever/chills, no neck pain.  No focal weakness, no changes in vision or speech.  Loud noises and light make symptoms worse. There are no other associated systemic symptoms, there are no other alleviating or modifying factors.   Past Medical History  Diagnosis Date  . Thyroid disease   . Diverticulitis   . Hypertension   . Diabetes mellitus   . Hypokalemia   . Hyperlipemia   . Sleep apnea   . Anemia   . GERD (gastroesophageal reflux disease)   . Stricture and stenosis of esophagus   . Cancer     basil cell  . Obesity     Past Surgical History  Procedure Laterality Date  . Gastric bypass      Family History  Problem Relation Age of Onset  . Brain cancer Father   . Diabetes Son   . Heart disease Mother   . Colon cancer Neg Hx     History  Substance Use Topics  . Smoking status: Former Smoker    Types: Cigarettes  . Smokeless tobacco: Never Used  . Alcohol Use: No    OB History   Grav Para Term Preterm Abortions TAB SAB Ect Mult Living                  Review of Systems ROS reviewed and all otherwise negative except for mentioned in HPI  Allergies  Clarithromycin  Home Medications   Current Outpatient Rx  Name  Route  Sig  Dispense  Refill  . calcium-vitamin D (OSCAL WITH D) 500-200 MG-UNIT per tablet   Oral   Take 1 tablet by  mouth daily.         . cholecalciferol (VITAMIN D) 1000 UNITS tablet   Oral   Take 1,000 Units by mouth daily.         . citalopram (CELEXA) 20 MG tablet   Oral   Take 20 mg by mouth daily.         Marland Kitchen HYDROcodone-acetaminophen (NORCO) 7.5-325 MG per tablet   Oral   Take 1 tablet by mouth every 6 (six) hours as needed. For pain         . levothyroxine (SYNTHROID, LEVOTHROID) 100 MCG tablet   Oral   Take by mouth daily.         . metoprolol (TOPROL-XL) 100 MG 24 hr tablet   Oral   Take 1 tablet (100 mg total) by mouth daily.   30 tablet   0   . Multiple Vitamin (MULTIVITAMIN) capsule   Oral   Take 1 capsule by mouth daily.         Marland Kitchen omeprazole (PRILOSEC) 20 MG capsule   Oral   Take 20 mg by mouth daily.         . ondansetron (ZOFRAN) 4 MG tablet   Oral  Take 1 tablet (4 mg total) by mouth every 6 (six) hours.   12 tablet   0   . potassium chloride (MICRO-K) 10 MEQ CR capsule   Oral   Take 10 mEq by mouth 3 (three) times daily.         . pramipexole (MIRAPEX) 0.25 MG tablet   Oral   Take 0.25 mg by mouth daily.          Marland Kitchen triamterene-hydrochlorothiazide (MAXZIDE) 75-50 MG per tablet   Oral   Take 1 tablet by mouth daily.         . vitamin B-12 (CYANOCOBALAMIN) 1000 MCG tablet   Oral   Take 1,000 mcg by mouth daily.         Marland Kitchen zolpidem (AMBIEN) 10 MG tablet   Oral   Take 10 mg by mouth at bedtime as needed. For sleep           BP 126/79  Pulse 79  Temp(Src) 98.3 F (36.8 C) (Oral)  Resp 18  SpO2 99% Vitals reviewed Physical Exam Physical Examination: General appearance - alert, well appearing, and in no distress Mental status - alert, oriented to person, place, and time Eyes - pupils equal and reactive, extraocular eye movements intact Mouth - mucous membranes moist, pharynx normal without lesions Chest - clear to auscultation, no wheezes, rales or rhonchi, symmetric air entry Heart - normal rate, regular rhythm, normal S1,  S2, no murmurs, rubs, clicks or gallops Abdomen - soft, nontender, nondistended, no masses or organomegaly Neurological - alert, oriented, normal speech, strength 5/5 in extremities x 4, sensation intact Extremities - peripheral pulses normal, no pedal edema, no clubbing or cyanosis Skin - normal coloration and turgor, no rashes  ED Course  Procedures (including critical care time)  10:23 AM pt rechecked, she states she feels some improvement in headache and would prefer to rest in her own bed.    Labs Reviewed - No data to display No results found.   1. Migraine       MDM  Pt presenting with c/o migraine similar to her prior migraine headaches.  No focal neurologic weakness or symptoms.  Pt treated with reglan, benadryl, toradol and IV hydration.  Pt got some improvement in headache and was requesting to be discharged so she can rest at home.  Discharged with strict return precautions.  Pt agreeable with plan.        Ethelda Chick, MD 03/26/13 [phone]

## 2013-05-21 ENCOUNTER — Other Ambulatory Visit (HOSPITAL_COMMUNITY): Payer: Self-pay | Admitting: Family Medicine

## 2013-05-21 DIAGNOSIS — Z1231 Encounter for screening mammogram for malignant neoplasm of breast: Secondary | ICD-10-CM

## 2013-05-22 ENCOUNTER — Telehealth (INDEPENDENT_AMBULATORY_CARE_PROVIDER_SITE_OTHER): Payer: Self-pay | Admitting: General Surgery

## 2013-05-22 NOTE — Telephone Encounter (Signed)
05/22/13 - Tried to reach patient for bariatric recall.  RNY done by Dr. Johna Sheriff 07/25/09.   All phone numbers disconnected.  Pt now has a PA address.

## 2013-06-04 ENCOUNTER — Ambulatory Visit (HOSPITAL_COMMUNITY): Payer: No Typology Code available for payment source

## 2014-07-19 ENCOUNTER — Encounter: Payer: Self-pay | Admitting: Gastroenterology

## 2016-07-18 ENCOUNTER — Encounter (HOSPITAL_COMMUNITY): Payer: Self-pay

## 2016-08-29 ENCOUNTER — Telehealth (HOSPITAL_COMMUNITY): Payer: Self-pay

## 2016-08-29 NOTE — Telephone Encounter (Signed)
This patient is overdue for recommended follow-up with a bariatric surgeon at [facility]. A letter was mailed to the address on file 10.4.17 from both [facility] & [facility] in attempt to reestablish post-op care. Letter has been returned to [facility] marked undeliverable, unable to forward. No additional address on file in [facility] or [employer]. Information was shared with Frances Jackson today at [facility] so she may contact the patient via phone in attempt to get the patient scheduled for an appointment in their office. °

## 2017-02-26 ENCOUNTER — Encounter (HOSPITAL_COMMUNITY): Payer: Self-pay

## 2017-04-11 ENCOUNTER — Telehealth (HOSPITAL_COMMUNITY): Payer: Self-pay

## 2017-04-11 NOTE — Telephone Encounter (Signed)
This patient is overdue for recommended follow-up with a bariatric surgeon at [facility]. A letter was mailed to the address on file 02/26/17 from both [place] in attempt to reestablish post-op care. Letter has been returned to [employer] marked undeliverable, unable to forward. No additional address or email on file in [facility] or [employer]. Information was shared with Mallory Shirk today at [facility] so she may contact the patient via phone in attempt to get the patient scheduled for an appointment in their office.

## 2021-10-15 DEATH — deceased
